# Patient Record
Sex: Female | Born: 1935 | Race: White | Hispanic: No | Marital: Married | Smoking: Never smoker
Health system: Southern US, Community
[De-identification: ages and names within clinical notes are randomized; demographics above are authoritative.]

## PROBLEM LIST (undated history)

## (undated) DIAGNOSIS — I10 Essential (primary) hypertension: Secondary | ICD-10-CM

## (undated) DIAGNOSIS — Z87440 Personal history of urinary (tract) infections: Secondary | ICD-10-CM

## (undated) DIAGNOSIS — E785 Hyperlipidemia, unspecified: Secondary | ICD-10-CM

## (undated) DIAGNOSIS — M40209 Unspecified kyphosis, site unspecified: Secondary | ICD-10-CM

## (undated) DIAGNOSIS — M810 Age-related osteoporosis without current pathological fracture: Secondary | ICD-10-CM

## (undated) DIAGNOSIS — K222 Esophageal obstruction: Secondary | ICD-10-CM

## (undated) DIAGNOSIS — M1990 Unspecified osteoarthritis, unspecified site: Secondary | ICD-10-CM

## (undated) DIAGNOSIS — K219 Gastro-esophageal reflux disease without esophagitis: Secondary | ICD-10-CM

## (undated) DIAGNOSIS — K449 Diaphragmatic hernia without obstruction or gangrene: Secondary | ICD-10-CM

## (undated) HISTORY — PX: TUBAL LIGATION: SHX77

## (undated) HISTORY — DX: Age-related osteoporosis without current pathological fracture: M81.0

## (undated) HISTORY — PX: ABDOMINAL HYSTERECTOMY: SHX81

## (undated) HISTORY — DX: Hyperlipidemia, unspecified: E78.5

## (undated) HISTORY — DX: Diaphragmatic hernia without obstruction or gangrene: K44.9

## (undated) HISTORY — PX: BREAST SURGERY: SHX581

## (undated) HISTORY — DX: Essential (primary) hypertension: I10

## (undated) HISTORY — DX: Esophageal obstruction: K22.2

## (undated) HISTORY — DX: Personal history of urinary (tract) infections: Z87.440

## (undated) HISTORY — DX: Unspecified osteoarthritis, unspecified site: M19.90

## (undated) HISTORY — DX: Unspecified kyphosis, site unspecified: M40.209

## (undated) HISTORY — DX: Gastro-esophageal reflux disease without esophagitis: K21.9

## (undated) HISTORY — PX: HIP SURGERY: SHX245

---

## 1998-03-05 ENCOUNTER — Other Ambulatory Visit: Admission: RE | Admit: 1998-03-05 | Discharge: 1998-03-05 | Payer: Self-pay | Admitting: Gynecology

## 1998-05-05 ENCOUNTER — Other Ambulatory Visit: Admission: RE | Admit: 1998-05-05 | Discharge: 1998-05-05 | Payer: Self-pay | Admitting: Gynecology

## 1999-05-10 ENCOUNTER — Other Ambulatory Visit: Admission: RE | Admit: 1999-05-10 | Discharge: 1999-05-10 | Payer: Self-pay | Admitting: Gynecology

## 2000-07-10 ENCOUNTER — Other Ambulatory Visit: Admission: RE | Admit: 2000-07-10 | Discharge: 2000-07-10 | Payer: Self-pay | Admitting: Gynecology

## 2001-07-12 ENCOUNTER — Other Ambulatory Visit: Admission: RE | Admit: 2001-07-12 | Discharge: 2001-07-12 | Payer: Self-pay | Admitting: Gynecology

## 2002-08-12 ENCOUNTER — Other Ambulatory Visit: Admission: RE | Admit: 2002-08-12 | Discharge: 2002-08-12 | Payer: Self-pay | Admitting: Gynecology

## 2003-08-25 ENCOUNTER — Other Ambulatory Visit: Admission: RE | Admit: 2003-08-25 | Discharge: 2003-08-25 | Payer: Self-pay | Admitting: Gynecology

## 2005-09-21 ENCOUNTER — Ambulatory Visit: Payer: Self-pay | Admitting: Internal Medicine

## 2005-10-06 ENCOUNTER — Other Ambulatory Visit: Admission: RE | Admit: 2005-10-06 | Discharge: 2005-10-06 | Payer: Self-pay | Admitting: Gynecology

## 2006-09-19 DIAGNOSIS — K449 Diaphragmatic hernia without obstruction or gangrene: Secondary | ICD-10-CM

## 2006-09-19 DIAGNOSIS — K219 Gastro-esophageal reflux disease without esophagitis: Secondary | ICD-10-CM

## 2006-09-19 DIAGNOSIS — K222 Esophageal obstruction: Secondary | ICD-10-CM

## 2006-09-19 HISTORY — DX: Esophageal obstruction: K22.2

## 2006-09-19 HISTORY — DX: Gastro-esophageal reflux disease without esophagitis: K21.9

## 2006-09-19 HISTORY — DX: Diaphragmatic hernia without obstruction or gangrene: K44.9

## 2006-11-14 ENCOUNTER — Ambulatory Visit (HOSPITAL_COMMUNITY): Admission: RE | Admit: 2006-11-14 | Discharge: 2006-11-14 | Payer: Self-pay | Admitting: Internal Medicine

## 2006-11-17 ENCOUNTER — Ambulatory Visit: Payer: Self-pay | Admitting: Internal Medicine

## 2006-12-19 ENCOUNTER — Ambulatory Visit (HOSPITAL_COMMUNITY): Admission: RE | Admit: 2006-12-19 | Discharge: 2006-12-19 | Payer: Self-pay | Admitting: Internal Medicine

## 2006-12-25 ENCOUNTER — Ambulatory Visit: Payer: Self-pay | Admitting: Internal Medicine

## 2007-11-06 ENCOUNTER — Ambulatory Visit: Payer: Self-pay | Admitting: Internal Medicine

## 2007-11-20 ENCOUNTER — Ambulatory Visit: Payer: Self-pay | Admitting: Internal Medicine

## 2008-03-19 ENCOUNTER — Encounter: Admission: RE | Admit: 2008-03-19 | Discharge: 2008-03-19 | Payer: Self-pay | Admitting: Unknown Physician Specialty

## 2008-03-20 ENCOUNTER — Encounter: Payer: Self-pay | Admitting: Internal Medicine

## 2008-03-26 ENCOUNTER — Telehealth: Payer: Self-pay | Admitting: Internal Medicine

## 2008-12-05 ENCOUNTER — Encounter: Admission: RE | Admit: 2008-12-05 | Discharge: 2008-12-05 | Payer: Self-pay | Admitting: Unknown Physician Specialty

## 2009-05-26 ENCOUNTER — Ambulatory Visit: Payer: Self-pay | Admitting: Family Medicine

## 2009-05-26 DIAGNOSIS — IMO0002 Reserved for concepts with insufficient information to code with codable children: Secondary | ICD-10-CM | POA: Insufficient documentation

## 2009-05-27 ENCOUNTER — Encounter: Payer: Self-pay | Admitting: Family Medicine

## 2009-06-01 ENCOUNTER — Encounter: Payer: Self-pay | Admitting: Internal Medicine

## 2010-02-26 ENCOUNTER — Encounter: Payer: Self-pay | Admitting: Internal Medicine

## 2010-03-22 ENCOUNTER — Ambulatory Visit: Payer: Self-pay | Admitting: Family Medicine

## 2010-03-22 DIAGNOSIS — B356 Tinea cruris: Secondary | ICD-10-CM | POA: Insufficient documentation

## 2010-10-19 NOTE — Assessment & Plan Note (Signed)
Summary: RASH/TJ   Vital Signs:  Patient Profile:   75 Years Old Female CC:      rash bilateral groin X yesterday Weight:      153 pounds O2 Sat:      99 % O2 treatment:    Room Air Temp:     97.8 degrees F oral Pulse rate:   94 / minute Resp:     14 per minute BP sitting:   166 / 75  (right arm) Cuff size:   regular  Pt. in pain?   no  Vitals Entered By: Lajean Saver RN (March 22, 2010 1:41 PM)                   Updated Prior Medication List: LISINOPRIL 20 MG TABS (LISINOPRIL) once a day LOPRESSOR HCT 50-25 MG TABS (METOPROLOL-HYDROCHLOROTHIAZIDE) once a day ECOTRIN LOW STRENGTH 81 MG TBEC (ASPIRIN) once a day PREVACID 15 MG CPDR (LANSOPRAZOLE) once a day * CALCIUM 1200MG  once a day * VITAMIN D3 1000MG  once a day * MULITVITAMIN once a day  Current Allergies (reviewed today): ! CODEINEHistory of Present Illness Chief Complaint: rash bilateral groin X yesterday History of Present Illness:  Subjective:  Patient noticed a bilateral rash in her groin area yesterday while taking a shower.  She has had no itching or discomfort and is not sure how long the rash has been there.  REVIEW OF SYSTEMS Constitutional Symptoms      Denies fever, chills, night sweats, weight loss, weight gain, and fatigue.  Eyes       Denies change in vision, eye pain, eye discharge, glasses, contact lenses, and eye surgery. Ear/Nose/Throat/Mouth       Denies hearing loss/aids, change in hearing, ear pain, ear discharge, dizziness, frequent runny nose, frequent nose bleeds, sinus problems, sore throat, hoarseness, and tooth pain or bleeding.  Respiratory       Denies dry cough, productive cough, wheezing, shortness of breath, asthma, bronchitis, and emphysema/COPD.  Cardiovascular       Denies murmurs, chest pain, and tires easily with exhertion.    Gastrointestinal       Denies stomach pain, nausea/vomiting, diarrhea, constipation, blood in bowel movements, and indigestion. Genitourniary    Denies painful urination, kidney stones, and loss of urinary control. Neurological       Denies paralysis, seizures, and fainting/blackouts. Musculoskeletal       Denies muscle pain, joint pain, joint stiffness, decreased range of motion, redness, swelling, muscle weakness, and gout.  Skin       Denies bruising, unusual mles/lumps or sores, and hair/skin or nail changes.      Comments: groin Psych       Denies mood changes, temper/anger issues, anxiety/stress, speech problems, depression, and sleep problems. Other Comments: patient states she started using new bath soap 1 week ago. Patient is sexually active with husband   Past History:  Past Medical History: Reviewed history from 05/26/2009 and no changes required. high blood pressure  Past Surgical History: Reviewed history from 05/26/2009 and no changes required. Denies surgical history  Family History: Reviewed history from 05/26/2009 and no changes required. brother - cancer  Social History: Alcohol use-no Drug use-no tobacco use - no Retired Married   Objective:  Appearance:  Patient appears healthy, stated age, and in no acute distress  Skin:  inguinal area:  bilateral macular erythema in intertriginous areas with well circumscribed margins.  No tenderness or swelling.  No drainage. Assessment New Problems: TINEA CRURIS (ICD-110.3)  Plan New Medications/Changes: KETOCONAZOLE 2 % CREA (KETOCONAZOLE) Apply thin layer to affected area two times a day  #45gm x 1, 03/22/2010, Donna Christen MD  New Orders: Est. Patient Level III [phone] Planning Comments:   Begin Nizoral cream two times a day; continue for about 3 days after rash resolved.  May apply 1% HC cream also.  Follow-up with PCP if does not resolve.   The patient and/or caregiver has been counseled thoroughly with regard to medications prescribed including dosage, schedule, interactions, rationale for use, and possible side effects and they verbalize  understanding.  Diagnoses and expected course of recovery discussed and will return if not improved as expected or if the condition worsens. Patient and/or caregiver verbalized understanding.  Prescriptions: KETOCONAZOLE 2 % CREA (KETOCONAZOLE) Apply thin layer to affected area two times a day  #45gm x 1   Entered and Authorized by:   Donna Christen MD   Signed by:   Donna Christen MD on 03/22/2010   Method used:   Print then Give to Patient   RxID:   [id number]   Patient Instructions: 1)  May apply 1% Hydrocortisone cream twice daily for inflammation  Orders Added: 1)  Est. Patient Level III [95621]

## 2010-10-19 NOTE — Letter (Signed)
Summary: [facility]  [facility]   Imported By: Sherian Rein 03/11/2010 09:00:52  _____________________________________________________________________  External Attachment:    Type:   Image     Comment:   External Document

## 2012-05-07 ENCOUNTER — Telehealth: Payer: Self-pay | Admitting: Internal Medicine

## 2012-05-07 NOTE — Telephone Encounter (Signed)
Pt states that she has had hurting and burning right below her ribcage. She is taking prevacid and took pepto bismol Saturday. States she took 3 tablets. This morning her stool was black. Discussed with the pt that the pepto will cause her stool to be black. Scheduled pt to see Dr. Marina Goodell 05/14/12@2 :15pm. Pt aware of appt date and time. Pt knows to call her PCP if she needs to be seen prior to that date.

## 2012-05-14 ENCOUNTER — Encounter: Payer: Self-pay | Admitting: Internal Medicine

## 2012-05-14 ENCOUNTER — Ambulatory Visit (INDEPENDENT_AMBULATORY_CARE_PROVIDER_SITE_OTHER): Payer: Medicare Other | Admitting: Internal Medicine

## 2012-05-14 VITALS — BP 134/78 | HR 60 | Ht 60.0 in | Wt 150.0 lb

## 2012-05-14 DIAGNOSIS — R1310 Dysphagia, unspecified: Secondary | ICD-10-CM

## 2012-05-14 DIAGNOSIS — K219 Gastro-esophageal reflux disease without esophagitis: Secondary | ICD-10-CM

## 2012-05-14 DIAGNOSIS — R1013 Epigastric pain: Secondary | ICD-10-CM

## 2012-05-14 MED ORDER — DEXLANSOPRAZOLE 60 MG PO CPDR: 60.0000 mg | DELAYED_RELEASE_CAPSULE | Freq: Every day | ORAL | Status: DC

## 2012-05-14 NOTE — Patient Instructions (Addendum)
You have been scheduled for an abdominal ultrasound at [facility] (1st floor of hospital) on 05-16-12 at 8:00am. Please arrive 15 minutes prior to your appointment for registration. Make certain not to have anything to eat or drink after midnight prior to your appointment. Should you need to reschedule your appointment, please contact radiology at [phone].  You have been scheduled for an endoscopy with propofol. Please follow written instructions given to you at your visit today. If you use inhalers (even only as needed), please bring them with you on the day of your procedure.  We have given you some samples of Dexilant.  Take one a day

## 2012-05-14 NOTE — Progress Notes (Signed)
HISTORY OF PRESENT ILLNESS:  Destiny Bruce is a 76 y.o. female with the below listed medical history who is seen in this office for GERD complicated by peptic stricture. She presents today with complaints of worsening reflux disease and recurrent dysphagia. For her reflux, she has been on over-the-counter Prevacid (presumably 15 mg). 9 days ago she developed herbal epigastric burning. Since that time, she has had some pyrosis. She has changed her diet to include more bland foods. She thinks this helps. She does not identify any additional exacerbating or relieving factors. She did undergo upper endoscopy with esophageal dilation 2008. She feels like her dysphagia has been worse over the past 6-8 months. She denies any lower GI complaints. She had a normal colonoscopy in March of 2009. Her chronic medical problems are stable.  REVIEW OF SYSTEMS:  All non-GI ROS negative except for  Past Medical History  Diagnosis Date  . Hx: UTI (urinary tract infection)   . GERD (gastroesophageal reflux disease) 2008  . Stricture and stenosis of esophagus 2008  . Hiatal hernia 2008  . Arthritis   . Hypertension   . Hyperlipemia     Past Surgical History  Procedure Date  . Hip surgery   . Tubal ligation   . Breast surgery     Cyst removed     Social History Destiny Bruce  reports that she has never smoked. She has never used smokeless tobacco. She reports that she does not drink alcohol or use illicit drugs.  family history is negative for Colon cancer.  Allergies  Allergen Reactions  . Codeine        PHYSICAL EXAMINATION: Vital signs: BP 134/78  Pulse 60  Ht 5' (1.524 m)  Wt 150 lb (68.04 kg)  BMI 29.30 kg/m2  Constitutional: generally well-appearing, no acute distress Psychiatric: alert and oriented x3, cooperative Eyes: extraocular movements intact, anicteric, conjunctiva pink Mouth: oral pharynx moist, no lesions Neck: supple no lymphadenopathy Cardiovascular: heart  regular rate and rhythm, no murmur Lungs: clear to auscultation bilaterally Abdomen: soft, nontender, nondistended, no obvious ascites, no peritoneal signs, normal bowel sounds, no organomegaly Rectal:omitted Extremities: no lower extremity edema bilaterally Skin: no lesions on visible extremities Neuro: No focal deficits.   ASSESSMENT:  #1. Worsening reflux on low-dose Prevacid #2. Recurrent dysphagia likely due to peptic stricture #3. Normal screening colonoscopy 2009   PLAN:  #1. Reflux precautions #2. Initiate Dexilant 60 mg daily. Multiple samples provided #3. Schedule upper endoscopy with esophageal dilation.The nature of the procedure, as well as the risks, benefits, and alternatives were carefully and thoroughly reviewed with the patient. Ample time for discussion and questions allowed. The patient understood, was satisfied, and agreed to proceed.

## 2012-05-16 ENCOUNTER — Ambulatory Visit (HOSPITAL_COMMUNITY)
Admission: RE | Admit: 2012-05-16 | Discharge: 2012-05-16 | Disposition: A | Discharge status: Home / Self Care | Payer: Medicare Other | Source: Ambulatory Visit | Attending: Internal Medicine | Admitting: Internal Medicine

## 2012-05-16 DIAGNOSIS — K219 Gastro-esophageal reflux disease without esophagitis: Secondary | ICD-10-CM

## 2012-05-16 DIAGNOSIS — R1310 Dysphagia, unspecified: Secondary | ICD-10-CM

## 2012-05-16 DIAGNOSIS — N289 Disorder of kidney and ureter, unspecified: Secondary | ICD-10-CM | POA: Insufficient documentation

## 2012-05-16 DIAGNOSIS — R1013 Epigastric pain: Secondary | ICD-10-CM | POA: Insufficient documentation

## 2012-05-29 ENCOUNTER — Encounter: Payer: Self-pay | Admitting: Internal Medicine

## 2012-05-29 ENCOUNTER — Ambulatory Visit (AMBULATORY_SURGERY_CENTER): Payer: Medicare Other | Admitting: Internal Medicine

## 2012-05-29 VITALS — BP 125/69 | HR 73 | Temp 97.4°F | Resp 14 | Ht 60.0 in | Wt 150.0 lb

## 2012-05-29 DIAGNOSIS — R1013 Epigastric pain: Secondary | ICD-10-CM

## 2012-05-29 DIAGNOSIS — R1310 Dysphagia, unspecified: Secondary | ICD-10-CM

## 2012-05-29 DIAGNOSIS — K219 Gastro-esophageal reflux disease without esophagitis: Secondary | ICD-10-CM

## 2012-05-29 DIAGNOSIS — K222 Esophageal obstruction: Secondary | ICD-10-CM

## 2012-05-29 MED ORDER — SODIUM CHLORIDE 0.9 % IV SOLN: 500.0000 mL | INTRAVENOUS | Status: DC

## 2012-05-29 MED ORDER — OMEPRAZOLE 40 MG PO CPDR: 40.0000 mg | DELAYED_RELEASE_CAPSULE | Freq: Every day | ORAL | Status: DC

## 2012-05-29 NOTE — Op Note (Signed)
[facility] [address] [place], [address]   ENDOSCOPY PROCEDURE REPORT  PATIENT: Destiny, Bruce  MR#: [id number] BIRTHDATE: [birth year] , 76  yrs. old GENDER: Female ENDOSCOPIST: Roxy Cedar, MD REFERRED BY:  Office PROCEDURE DATE:  05/29/2012 PROCEDURE:  Balloon dilation of esophagus  - 18,19,67mm ASA CLASS:     Class II INDICATIONS:  history of esophageal reflux.; epigastric pain (better on Dexilant)   dysphagia. MEDICATIONS: MAC sedation, administered by CRNA and propofol (Diprivan) 180mg  IV TOPICAL ANESTHETIC: Cetacaine Spray  DESCRIPTION OF PROCEDURE: After the risks benefits and alternatives of the procedure were thoroughly explained, informed consent was obtained.  The LB [id number]-H180 [id number] endoscope was introduced through the mouth and advanced to the second portion of the duodenum. Without limitations.  The instrument was slowly withdrawn as the mucosa was fully examined.      ESOPHAGUS: A large caliber ring-likestricture was found at GEJ. STOMACH: A 5cm hiatus hernia was found.   The remainder of the upper endoscopy exam was otherwise normal.  Retroflexed views revealed the hiatal hernia.  THERAPY: TTS BALLOON SEQUENTIAL DILATION - 18,19,20MM. MILD RESISTANCE. NO HEME. TOLERATED WELL    The scope was then withdrawn from the patient and the procedure completed.  COMPLICATIONS: There were no complications. ENDOSCOPIC IMPRESSION: 1.   Stricture was found in the distal esophagus - s/p dilation 2.   Hiatus hernia was found 3.   The remainder of the upper endoscopy exam was otherwise normal  RECOMMENDATIONS: 1.  Clear liquids until 11am , then soft foods rest of day.  Resume prior diet tomorrow. 2.  Continue PPI (PRESCRIBE OMEPRAZOLE 40 MG DAILY; #30; ONE PO QD; 11 REFILLS) 3.  Follow clinical response to dilatation.  e]  eSigned:  Roxy Cedar, MD 05/29/2012 9:05 AM VZ:DGLOV Sharee Pimple, MD and The Patient

## 2012-05-29 NOTE — Progress Notes (Signed)
Patient did not have preoperative order for IV antibiotic SSI prophylaxis. (G8918)  Patient did not experience any of the following events: a burn prior to discharge; a fall within the facility; wrong site/side/patient/procedure/implant event; or a hospital transfer or hospital admission upon discharge from the facility. (G8907)  

## 2012-05-29 NOTE — Patient Instructions (Addendum)
YOU HAD AN ENDOSCOPIC PROCEDURE TODAY AT THE [facility]: Refer to the procedure report that was given to you for any specific questions about what was found during the examination.  If the procedure report does not answer your questions, please call your gastroenterologist to clarify.  If you requested that your care partner not be given the details of your procedure findings, then the procedure report has been included in a sealed envelope for you to review at your convenience later.  YOU SHOULD EXPECT: Some feelings of bloating in the abdomen. Passage of more gas than usual.  Walking can help get rid of the air that was put into your GI tract during the procedure and reduce the bloating. If you had a lower endoscopy (such as a colonoscopy or flexible sigmoidoscopy) you may notice spotting of blood in your stool or on the toilet paper. If you underwent a bowel prep for your procedure, then you may not have a normal bowel movement for a few days.  DIET: Your first meal following the procedure should be a light meal and then it is ok to progress to your normal diet.  A half-sandwich or bowl of soup is an example of a good first meal.  Heavy or fried foods are harder to digest and may make you feel nauseous or bloated.  Likewise meals heavy in dairy and vegetables can cause extra gas to form and this can also increase the bloating.  Drink plenty of fluids but you should avoid alcoholic beverages for 24 hours.  ACTIVITY: Your care partner should take you home directly after the procedure.  You should plan to take it easy, moving slowly for the rest of the day.  You can resume normal activity the day after the procedure however you should NOT DRIVE or use heavy machinery for 24 hours (because of the sedation medicines used during the test).    SYMPTOMS TO REPORT IMMEDIATELY: A gastroenterologist can be reached at any hour.  During normal business hours, 8:30 AM to 5:00 PM Monday through Friday,  call [phone].  After hours and on weekends, please call the GI answering service at [phone] who will take a message and have the physician on call contact you.   Following lower endoscopy (colonoscopy or flexible sigmoidoscopy):  Excessive amounts of blood in the stool  Significant tenderness or worsening of abdominal pains  Swelling of the abdomen that is new, acute  Fever of 100F or higher  Following upper endoscopy (EGD)  Vomiting of blood or coffee ground material  New chest pain or pain under the shoulder blades  Painful or persistently difficult swallowing  New shortness of breath  Fever of 100F or higher  Black, tarry-looking stools  FOLLOW UP: If any biopsies were taken you will be contacted by phone or by letter within the next 1-3 weeks.  Call your gastroenterologist if you have not heard about the biopsies in 3 weeks.  Our staff will call the home number listed on your records the next business day following your procedure to check on you and address any questions or concerns that you may have at that time regarding the information given to you following your procedure. This is a courtesy call and so if there is no answer at the home number and we have not heard from you through the emergency physician on call, we will assume that you have returned to your regular daily activities without incident.  SIGNATURES/CONFIDENTIALITY: You and/or your care   partner have signed paperwork which will be entered into your electronic medical record.  These signatures attest to the fact that that the information above on your After Visit Summary has been reviewed and is understood.  Full responsibility of the confidentiality of this discharge information lies with you and/or your care-partner.  

## 2012-05-30 ENCOUNTER — Telehealth: Payer: Self-pay | Admitting: *Deleted

## 2012-05-30 NOTE — Telephone Encounter (Signed)
  Follow up Call-  Call back number 05/29/2012  Post procedure Call Back phone  # [phone]  Permission to leave phone message Yes     Patient questions:  Do you have a fever, pain , or abdominal swelling? no Pain Score  0 *  Have you tolerated food without any problems? yes  Have you been able to return to your normal activities? yes  Do you have any questions about your discharge instructions: Diet   no Medications  no Follow up visit  no  Do you have questions or concerns about your Care? no  Actions: * If pain score is 4 or above: No action needed, pain <4.

## 2013-06-04 ENCOUNTER — Encounter: Payer: Self-pay | Admitting: *Deleted

## 2013-06-04 ENCOUNTER — Emergency Department
Admission: EM | Admit: 2013-06-04 | Discharge: 2013-06-04 | Disposition: A | Discharge status: Home / Self Care | Payer: Medicare Other | Source: Home / Self Care | Attending: Family Medicine | Admitting: Family Medicine

## 2013-06-04 DIAGNOSIS — L0291 Cutaneous abscess, unspecified: Secondary | ICD-10-CM

## 2013-06-04 DIAGNOSIS — L02519 Cutaneous abscess of unspecified hand: Secondary | ICD-10-CM

## 2013-06-04 MED ORDER — DOXYCYCLINE HYCLATE 100 MG PO CAPS: 100.0000 mg | ORAL_CAPSULE | Freq: Two times a day (BID) | ORAL | Status: AC

## 2013-06-04 NOTE — ED Provider Notes (Signed)
CSN: [id number]     Arrival date & time 06/04/13  1841 History   First MD Initiated Contact with Patient 06/04/13 1901     Chief Complaint  Patient presents with  . Abscess    HPI  Patient presents today with chief complaint of right fifth finger abscess. Patient's nose progressive redness and swelling of the proximal aspect of her right fifth digit of the past 2-3 days. No fevers or chills. Has become progressively tender. No drainage at this point though a habit-forming. Patient is nondiabetic. Nonsmoker. Past Medical History  Diagnosis Date  . Hx: UTI (urinary tract infection)   . GERD (gastroesophageal reflux disease) 2008  . Stricture and stenosis of esophagus 2008  . Hiatal hernia 2008  . Arthritis   . Hypertension   . Hyperlipemia    Past Surgical History  Procedure Laterality Date  . Hip surgery    . Tubal ligation    . Breast surgery      Cyst removed    Family History  Problem Relation Age of Onset  . Colon cancer Neg Hx    History  Substance Use Topics  . Smoking status: Never Smoker   . Smokeless tobacco: Never Used  . Alcohol Use: No   OB History   Grav Para Term Preterm Abortions TAB SAB Ect Mult Living                 Review of Systems  All other systems reviewed and are negative.    Allergies  Codeine  Home Medications   Current Outpatient Rx  Name  Route  Sig  Dispense  Refill  . aspirin (ECOTRIN LOW STRENGTH) 81 MG EC tablet   Oral   Take 81 mg by mouth daily. Swallow whole.         . Calcium-Vitamin A-Vitamin D (LIQUID CALCIUM PO)   Oral   Take by mouth as directed.         . Cholecalciferol (VITAMIN D3) 2000 UNITS TABS   Oral   Take 1 tablet by mouth daily.         . Coenzyme Q10 (COQ10 PO)   Oral   Take 1 capsule by mouth daily.         . Cyanocobalamin (VITAMIN B-12 PO)   Oral   Take 1 capsule by mouth daily.         Marland Kitchen EXPIRED: dexlansoprazole (DEXILANT) 60 MG capsule   Oral   Take 1 capsule (60 mg total)  by mouth daily.   25 capsule   0     Samples given to patient    Lot#        [id number]     ...   . doxycycline (VIBRAMYCIN) 100 MG capsule   Oral   Take 1 capsule (100 mg total) by mouth 2 (two) times daily.   14 capsule   0   . Krill Oil 300 MG CAPS   Oral   Take 1 capsule by mouth daily.         . Lansoprazole (PREVACID PO)   Oral   Take by mouth. Uses OTC         . lisinopril (PRINIVIL,ZESTRIL) 20 MG tablet   Oral   Take 20 mg by mouth daily.         . metoprolol tartrate (LOPRESSOR) 25 MG tablet   Oral   Take 25 mg by mouth daily.         Marland Kitchen EXPIRED: omeprazole (  PRILOSEC) 40 MG capsule   Oral   Take 1 capsule (40 mg total) by mouth daily.   30 capsule   11   . simvastatin (ZOCOR) 10 MG tablet   Oral   Take 10 mg by mouth at bedtime.         Marland Kitchen VITAMIN E PO   Oral   Take 1 capsule by mouth daily.          BP 137/85  Pulse 78  Temp(Src) 98.3 F (36.8 C) (Oral)  Resp 16  Ht 5' (1.524 m)  Wt 154 lb (69.854 kg)  BMI 30.08 kg/m2  SpO2 99% Physical Exam  Constitutional: She appears well-developed and well-nourished.  HENT:  Head: Normocephalic and atraumatic.  Eyes: Pupils are equal, round, and reactive to light.  Neck: Normal range of motion. Neck supple.  Cardiovascular: Normal rate and regular rhythm.   Pulmonary/Chest: Effort normal.  Abdominal: Soft.  Musculoskeletal: Normal range of motion.  Neurological: She is alert.  Skin: Rash noted.    ED Course  INCISION AND DRAINAGE Date/Time: 06/04/2013 7:31 PM Performed by: Doree Albee Authorized by: Doree Albee Consent: Verbal consent obtained. Risks and benefits: risks, benefits and alternatives were discussed Type: abscess Body area: upper extremity Location details: right hand Anesthesia method: No anesthesia needed. Superficial incision and drainage. Scalpel size: 11 Complexity: simple Drainage: purulent Wound treatment: wound left open Packing material: Vaseline  gauze Patient tolerance: Patient tolerated the procedure well with no immediate complications.   (including critical care time) Labs Review Labs Reviewed - No data to display Imaging Review No results found.  MDM   1. Abscess    Area superficially incised and drained at bedside with expression of purulent fluid. Wound culture obtained. Will place patient on doxycycline for soft tissue coverage. Discussed general, [place], infectious red flags the patient. Followup with PCP in the next 3-5 days for reevaluation in symptoms and not prove.     The patient and/or caregiver has been counseled thoroughly with regard to treatment plan and/or medications prescribed including dosage, schedule, interactions, rationale for use, and possible side effects and they verbalize understanding. Diagnoses and expected course of recovery discussed and will return if not improved as expected or if the condition worsens. Patient and/or caregiver verbalized understanding.         Doree Albee, MD 06/04/13 [phone]

## 2013-06-04 NOTE — ED Notes (Signed)
Pt c/o abscess on her RT 5th digit x 2 days.

## 2013-06-07 ENCOUNTER — Telehealth: Payer: Self-pay | Admitting: *Deleted

## 2013-06-07 LAB — WOUND CULTURE: Gram Stain: NONE SEEN

## 2013-06-12 ENCOUNTER — Telehealth: Payer: Self-pay | Admitting: Family Medicine

## 2013-06-12 MED ORDER — CEPHALEXIN 500 MG PO CAPS: 500.0000 mg | ORAL_CAPSULE | Freq: Three times a day (TID) | ORAL | Status: DC

## 2013-06-13 ENCOUNTER — Telehealth: Payer: Self-pay | Admitting: Family Medicine

## 2014-03-04 ENCOUNTER — Encounter: Payer: Self-pay | Admitting: Internal Medicine

## 2014-03-04 ENCOUNTER — Ambulatory Visit (INDEPENDENT_AMBULATORY_CARE_PROVIDER_SITE_OTHER): Payer: Commercial Managed Care - HMO | Admitting: Internal Medicine

## 2014-03-04 VITALS — BP 120/80 | HR 68 | Ht 60.0 in | Wt 146.6 lb

## 2014-03-04 DIAGNOSIS — K219 Gastro-esophageal reflux disease without esophagitis: Secondary | ICD-10-CM

## 2014-03-04 DIAGNOSIS — K222 Esophageal obstruction: Secondary | ICD-10-CM

## 2014-03-04 DIAGNOSIS — R195 Other fecal abnormalities: Secondary | ICD-10-CM

## 2014-03-04 MED ORDER — MOVIPREP 100 G PO SOLR: 1.0000 | Freq: Once | ORAL | Status: DC

## 2014-03-04 NOTE — Patient Instructions (Signed)

## 2014-03-04 NOTE — Progress Notes (Signed)
HISTORY OF PRESENT ILLNESS:  Destiny Bruce is a 78 y.o. female with multiple medical problems as listed below. She sent today by her primary care provider regarding Hemoccult-positive stool on routine testing with her annual physical exam. The patient was last seen in this office 05/14/2012 regarding worsening reflux with low dose Prevacid and recurrent dysphagia. She was prescribed Dexilant and subsequently underwent upper endoscopy with balloon dilation of a peptic stricture on 05/29/2012. She was to continue on PPI and followup as needed. She did undergo complete colonoscopy March 2009 for the purposes of routine screening. This was normal. Patient has resumed low-dose Prevacid. She denies reflux symptoms or recurrent dysphagia. She is known to have hemorrhoids but denies melena or hematochezia. She also denies weight loss or anemia.  REVIEW OF SYSTEMS:  All non-GI ROS negative except for arthritis  Past Medical History  Diagnosis Date  . Hx: UTI (urinary tract infection)   . GERD (gastroesophageal reflux disease) 2008  . Stricture and stenosis of esophagus 2008  . Hiatal hernia 2008  . Arthritis   . Hypertension   . Hyperlipemia   . Kyphosis   . Osteoporosis     Past Surgical History  Procedure Laterality Date  . Hip surgery    . Tubal ligation    . Breast surgery      Cyst removed     Social History Destiny Bruce  reports that she has never smoked. She has never used smokeless tobacco. She reports that she does not drink alcohol or use illicit drugs.  family history includes CAD in her maternal uncle; Cancer in her maternal grandfather and paternal aunt; GER disease in her mother; Heart disease in her sister; Heart failure in her father; Leukemia in her paternal uncle; Lung cancer in her brother; Prostate cancer in her maternal uncle. There is no history of Colon cancer.  Allergies  Allergen Reactions  . Codeine        PHYSICAL EXAMINATION: Vital signs: BP  120/80  Pulse 68  Ht 5' (1.524 m)  Wt 146 lb 9.6 oz (66.497 kg)  BMI 28.63 kg/m2  Constitutional: generally well-appearing, no acute distress Psychiatric: alert and oriented x3, cooperative Eyes: extraocular movements intact, anicteric, conjunctiva pink Mouth: oral pharynx moist, no lesions Neck: supple no lymphadenopathy Cardiovascular: heart regular rate and rhythm, no murmur Lungs: clear to auscultation bilaterally Abdomen: soft, nontender, nondistended, no obvious ascites, no peritoneal signs, normal bowel sounds, no organomegaly Rectal: Deferred until colonoscopy Extremities: no lower extremity edema bilaterally Skin: no lesions on visible extremities Neuro: No focal deficits.   ASSESSMENT:  #1. Hemoccult-positive stool #2. GERD complicated by peptic stricture. Currently asymptomatic post dilation (2013) on PPI #3. Normal screening colonoscopy 2009 #4. General medical problems. Stable   PLAN:  #1. Continue reflux precautions and PPI #2. Schedule colonoscopy to evaluate Hemoccult-positive stool.The nature of the procedure, as well as the risks, benefits, and alternatives were carefully and thoroughly reviewed with the patient. Ample time for discussion and questions allowed. The patient understood, was satisfied, and agreed to proceed. Movi prep prescribed. The patient instructed on its use

## 2014-03-14 ENCOUNTER — Encounter: Payer: Self-pay | Admitting: Internal Medicine

## 2014-03-14 ENCOUNTER — Ambulatory Visit (AMBULATORY_SURGERY_CENTER): Payer: Commercial Managed Care - HMO | Admitting: Internal Medicine

## 2014-03-14 VITALS — BP 150/80 | HR 76 | Temp 96.1°F | Resp 27 | Ht 60.0 in | Wt 146.0 lb

## 2014-03-14 DIAGNOSIS — R195 Other fecal abnormalities: Secondary | ICD-10-CM

## 2014-03-14 MED ORDER — SODIUM CHLORIDE 0.9 % IV SOLN: 500.0000 mL | INTRAVENOUS | Status: DC

## 2014-03-14 NOTE — Progress Notes (Signed)
Report to PACU, RN, vss, BBS= Clear.  

## 2014-03-14 NOTE — Op Note (Signed)
[facility]  [address]. [place], [address]   COLONOSCOPY PROCEDURE REPORT  PATIENT: Destiny Bruce, Destiny Bruce  MR#: [id number] BIRTHDATE: Feb 11, 1936 , 78  yrs. old GENDER: Female ENDOSCOPIST: Eustace Quail, MD REFERRED TL:XBWIO Jones, FNP PROCEDURE DATE:  03/14/2014 PROCEDURE:   Colonoscopy, diagnostic First Screening Colonoscopy - Avg.  risk and is 50 yrs.  old or older - No.  Prior Negative Screening - Now for repeat screening. N/A  History of Adenoma - Now for follow-up colonoscopy & has been > or = to 3 yrs.  N/A  Polyps Removed Today? No.  Recommend repeat exam, <10 yrs? No. ASA CLASS:   Class II INDICATIONS:heme-positive stool.   Normal colonoscopy 11-2007 MEDICATIONS: MAC sedation, administered by CRNA and propofol (Diprivan) 200mg  IV  DESCRIPTION OF PROCEDURE:   After the risks benefits and alternatives of the procedure were thoroughly explained, informed consent was obtained.  A digital rectal exam revealed no abnormalities of the rectum.   The LB [id number]  endoscope was introduced through the anus and advanced to the cecum, which was identified by both the appendix and ileocecal valve. No adverse events experienced.   The quality of the prep was excellent, using MoviPrep  The instrument was then slowly withdrawn as the colon was fully examined.      COLON FINDINGS: An angiodysplastic lesion was found in the ascending colon.   The colon mucosa was otherwise normal.  Retroflexed views revealed no abnormalities. The time to cecum=3 minutes 12 seconds. Withdrawal time=9 minutes 52 seconds.  The scope was withdrawn and the procedure completed.  COMPLICATIONS: There were no complications.  ENDOSCOPIC IMPRESSION: 1.   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   The colon mucosa was otherwise normal  RECOMMENDATIONS: 1. Return to the care of your primary provider.  GI follow up as needed. No need for heme occult stools for screening in  this patient   eSigned:  Eustace Quail, MD 03/14/2014 10:16 AM   cc: The Patient and Dyane Dustman, MD

## 2014-03-14 NOTE — Patient Instructions (Signed)

## 2014-03-17 ENCOUNTER — Telehealth: Payer: Self-pay

## 2014-03-17 NOTE — Telephone Encounter (Signed)
  Follow up Call-  Call back number 03/14/2014 05/29/2012  Post procedure Call Back phone  # [phone]  Permission to leave phone message Yes Yes     Patient questions:  Do you have a fever, pain , or abdominal swelling? No. Pain Score  0 *  Have you tolerated food without any problems? Yes.    Have you been able to return to your normal activities? Yes.    Do you have any questions about your discharge instructions: Diet   No. Medications  No. Follow up visit  No.  Do you have questions or concerns about your Care? No.  Actions: * If pain score is 4 or above: No action needed, pain <4.  No problems per the pt. Maw

## 2014-06-05 IMAGING — US US ABDOMEN COMPLETE
1 series · 14 of 25 positions shown · non-contrast
Comparison: None.

CLINICAL DATA: Epigastric pain

COMPLETE ABDOMINAL ULTRASOUND

[Series 1: us abdomen complete · 0.24mm/px · 14 of 82 slices shown]
[im 1/82]
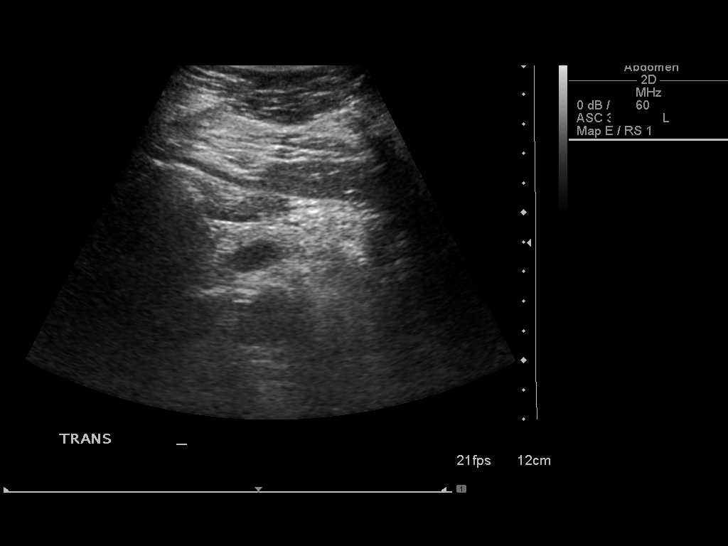
[im 7/82]
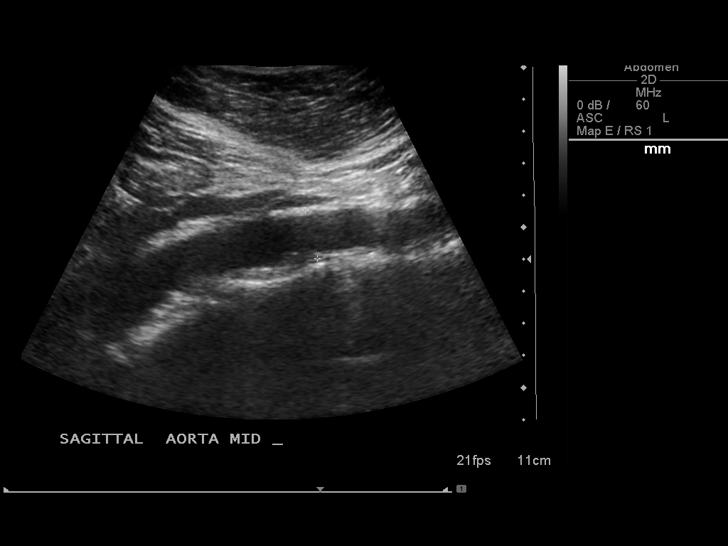
[im 14/82]
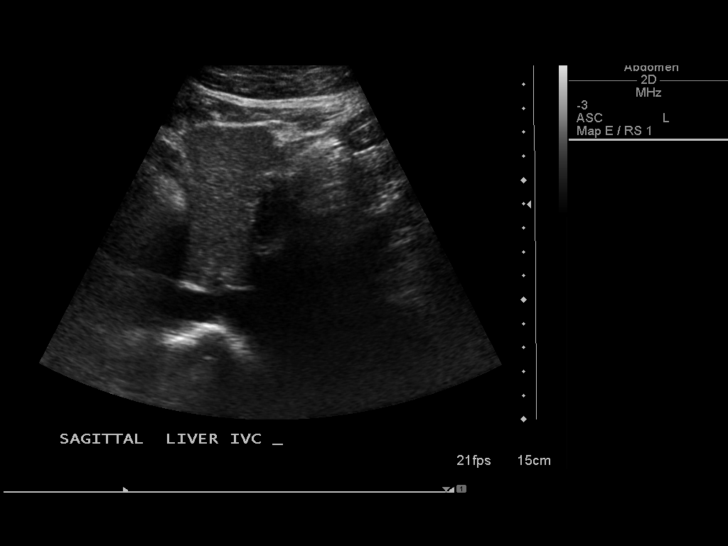
[im 21/82]
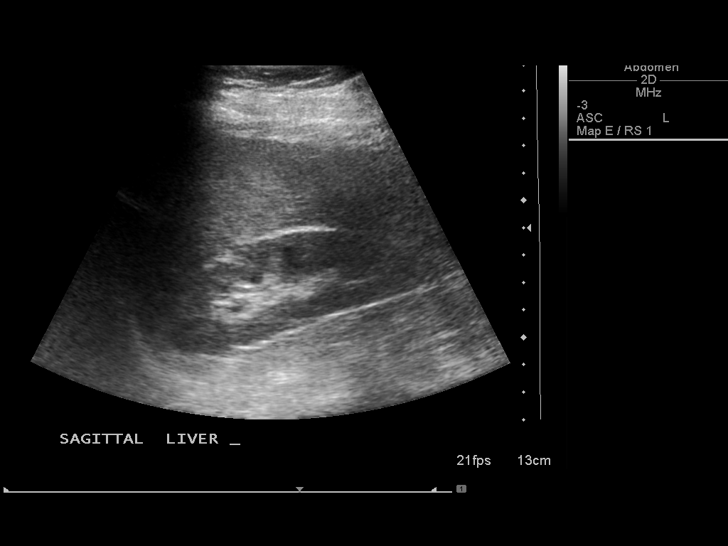
[im 28/82]
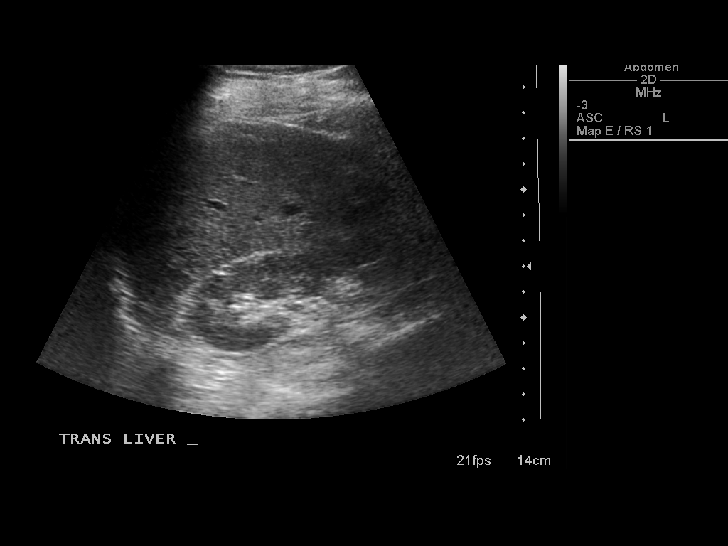
[im 31/82]
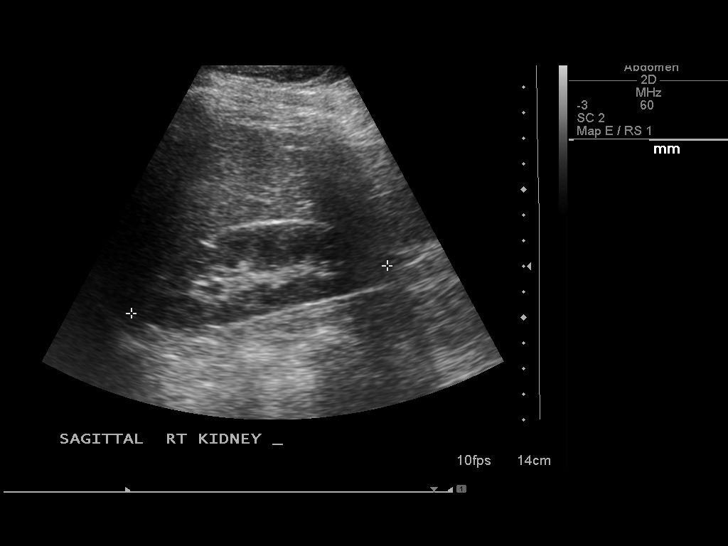
[im 38/82]
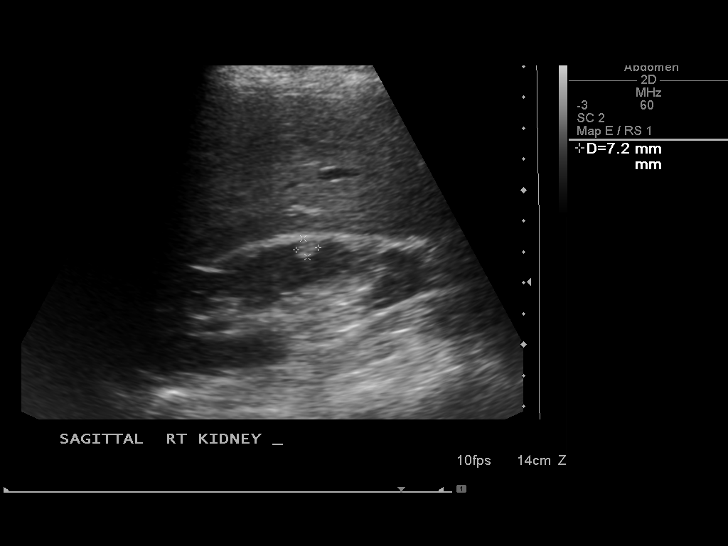
[im 44/82]
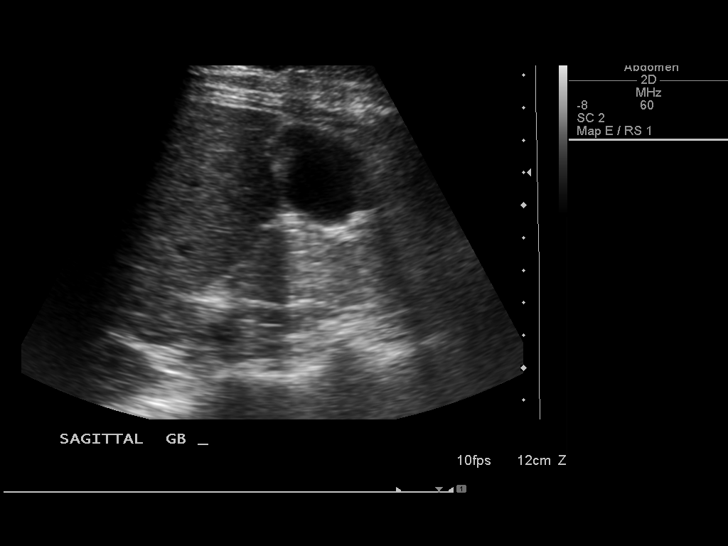
[im 51/82]
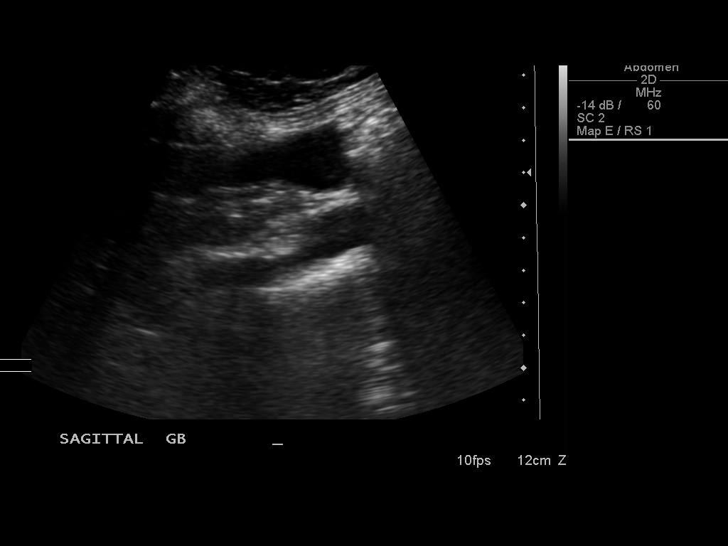
[im 55/82]
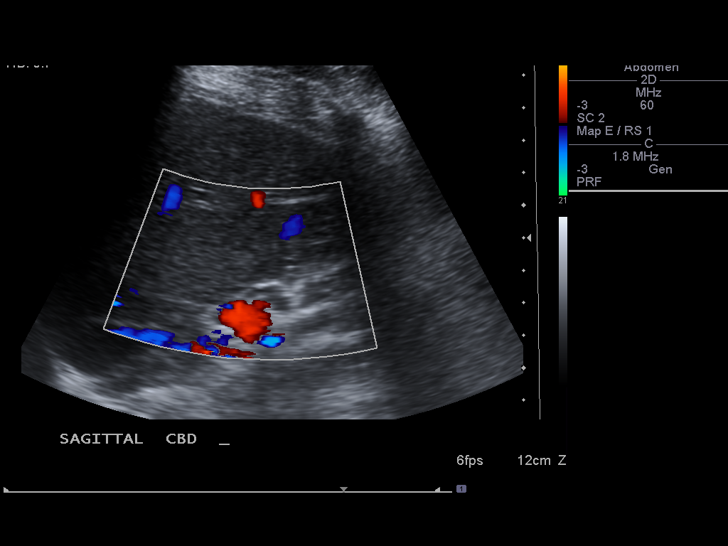
[im 61/82]
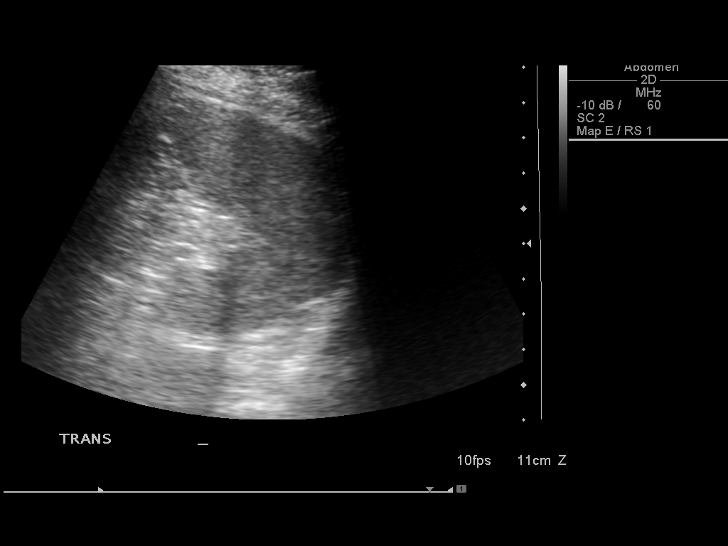
[im 68/82]
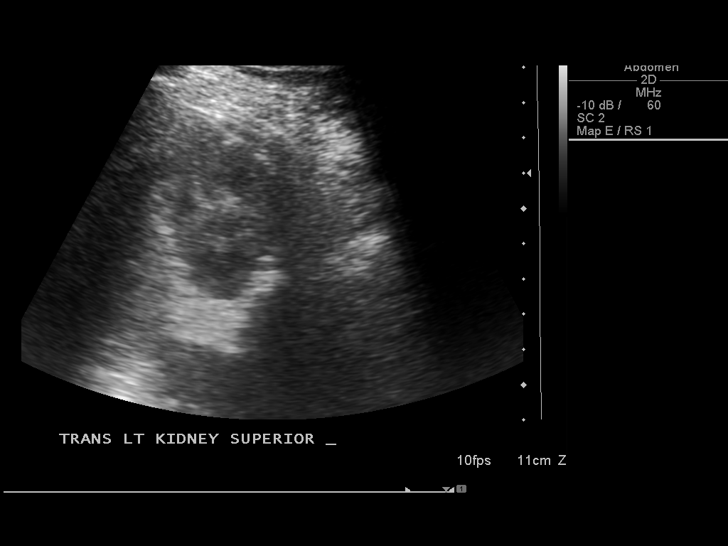
[im 75/82]
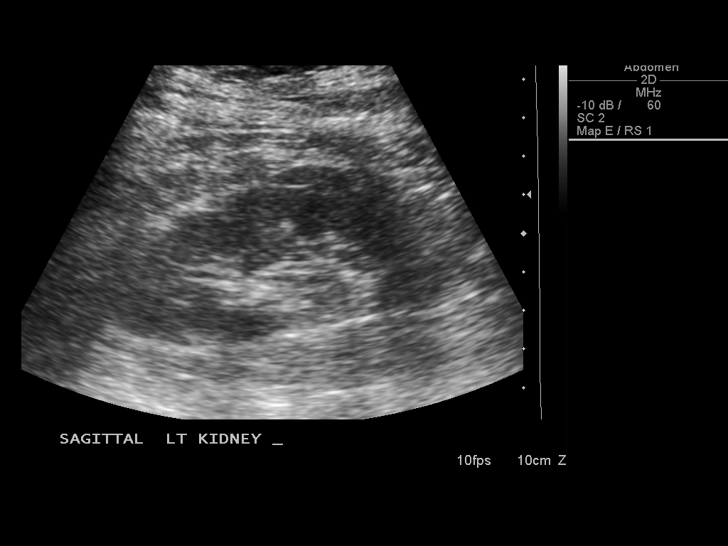
[im 82/82]
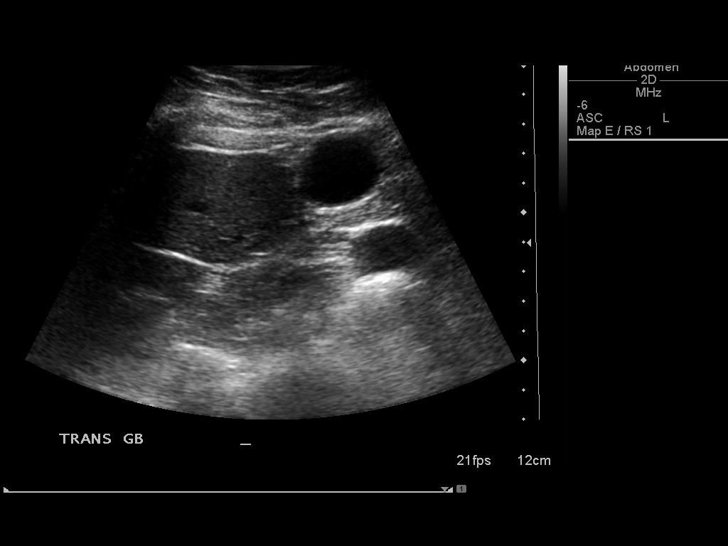

[14 of 25 positions shown; findings below may reference images not displayed]

FINDINGS: Gallbladder:  No gallstones, gallbladder wall thickening, or
pericholecystic fluid.

Common bile duct:  Normal at 5 mm

Liver:  No focal lesion identified.  Within normal limits in
parenchymal echogenicity.

IVC:  Appears normal.

Pancreas:  No focal abnormality seen.

Spleen:  Normal in size and echogenicity.

Right Kidney:  10.2cm in length.  Small cortical echoic lesion is
present in the mid right kidney measuring the 8 mm.

Left Kidney:  10.2cm in length.  Small cortical hyperechoic lesion
measuring 6 mm in the left kidney.

Abdominal aorta:  No aneurysm identified.
IMPRESSION: 1.. Normal gallbladder.

2.  Single small hyperechoic lesions within the left and right
kidney.  These likely represent small angiomyolipomas.  This should
be confirmed with a renal protocol contrast CT or MRI .

## 2015-01-08 DIAGNOSIS — N764 Abscess of vulva: Secondary | ICD-10-CM | POA: Diagnosis not present

## 2015-01-21 DIAGNOSIS — L570 Actinic keratosis: Secondary | ICD-10-CM | POA: Diagnosis not present

## 2015-01-21 DIAGNOSIS — C44612 Basal cell carcinoma of skin of right upper limb, including shoulder: Secondary | ICD-10-CM | POA: Diagnosis not present

## 2015-02-04 DIAGNOSIS — M810 Age-related osteoporosis without current pathological fracture: Secondary | ICD-10-CM | POA: Diagnosis not present

## 2015-02-04 DIAGNOSIS — E780 Pure hypercholesterolemia: Secondary | ICD-10-CM | POA: Diagnosis not present

## 2015-02-04 DIAGNOSIS — Z23 Encounter for immunization: Secondary | ICD-10-CM | POA: Diagnosis not present

## 2015-02-04 DIAGNOSIS — M170 Bilateral primary osteoarthritis of knee: Secondary | ICD-10-CM | POA: Diagnosis not present

## 2015-02-04 DIAGNOSIS — I10 Essential (primary) hypertension: Secondary | ICD-10-CM | POA: Diagnosis not present

## 2015-03-30 ENCOUNTER — Telehealth: Payer: Self-pay | Admitting: Internal Medicine

## 2015-03-30 NOTE — Telephone Encounter (Signed)
Pt states she is having pain when she swallows and when she tries anything with acid in it there is terrible burning. Pt scheduled to see Alonza Bogus PA tomorrow at 2:30pm. Pt aware of appt.

## 2015-03-31 ENCOUNTER — Encounter: Payer: Self-pay | Admitting: Gastroenterology

## 2015-03-31 ENCOUNTER — Ambulatory Visit (INDEPENDENT_AMBULATORY_CARE_PROVIDER_SITE_OTHER): Payer: Commercial Managed Care - HMO | Admitting: Gastroenterology

## 2015-03-31 VITALS — BP 138/84 | HR 72 | Ht 58.75 in | Wt 134.4 lb

## 2015-03-31 DIAGNOSIS — F458 Other somatoform disorders: Secondary | ICD-10-CM

## 2015-03-31 DIAGNOSIS — R198 Other specified symptoms and signs involving the digestive system and abdomen: Secondary | ICD-10-CM

## 2015-03-31 DIAGNOSIS — Z8719 Personal history of other diseases of the digestive system: Secondary | ICD-10-CM | POA: Diagnosis not present

## 2015-03-31 DIAGNOSIS — R1310 Dysphagia, unspecified: Secondary | ICD-10-CM | POA: Diagnosis not present

## 2015-03-31 DIAGNOSIS — R0989 Other specified symptoms and signs involving the circulatory and respiratory systems: Secondary | ICD-10-CM | POA: Insufficient documentation

## 2015-03-31 NOTE — Progress Notes (Signed)
Agree with assessment and plans as outlined 

## 2015-03-31 NOTE — Progress Notes (Signed)
     03/31/2015 Destiny Bruce [id number] March 15, 1936   History of Present Illness:  This is a pleasant 79 year old female who is known to Dr. Henrene Pastor.  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  Has history of GERD and has been on OTC prevacid once a day for quite some time.  Her last EGD was 05/2013 at which time she was found to have a hiatal hernia and a stricture in the distal esophagus that was dilated with balloon.  She presents to our office today with complaints of pain/burning in her esophagus as well as what sounds like a globus sensation.  She said that this began 5 days ago.  After a couple of days she increased her prevacid to twice a day and that seems like it may be helping but she is still having symptoms.  Denies sensation of food getting stuck.     Current Medications, Allergies, Past Medical History, Past Surgical History, Family History and Social History were reviewed in [employer] record.   Physical Exam: BP 138/84 mmHg  Pulse 72  Ht 4' 10.75" (1.492 m)  Wt 134 lb 6 oz (60.952 kg)  BMI 27.38 kg/m2 General: Well developed white female in no acute distress Head: Normocephalic and atraumatic Eyes:  Sclerae anicteric, conjunctiva pink  Ears: Normal auditory acuity Lungs: Clear throughout to auscultation Heart: Regular rate and rhythm Abdomen: Soft, non-distended.  Normal bowel sounds.  Non-tender. Musculoskeletal: Symmetrical with no gross deformities  Extremities: No edema  Neurological: Alert oriented x 4, grossly non-focal Psychological:  Alert and cooperative. Normal mood and affect  Assessment and Recommendations: -Odynophagia and globus sensation:  ? Due to reflux/esophagitis.  Slightly improved since increasing PPI to twice daily.  She will continue her prevacid twice a day for now, but will also schedule and EGD with Dr. Henrene Pastor for further evaluation.  The risks, benefits, and  alternatives were discussed with the patient and she consents to proceed.

## 2015-04-06 DIAGNOSIS — Z1231 Encounter for screening mammogram for malignant neoplasm of breast: Secondary | ICD-10-CM | POA: Diagnosis not present

## 2015-04-09 ENCOUNTER — Encounter: Payer: Self-pay | Admitting: Internal Medicine

## 2015-04-09 ENCOUNTER — Ambulatory Visit (AMBULATORY_SURGERY_CENTER): Payer: Commercial Managed Care - HMO | Admitting: Internal Medicine

## 2015-04-09 VITALS — BP 134/67 | HR 82 | Temp 96.7°F | Resp 17 | Ht 58.75 in | Wt 134.0 lb

## 2015-04-09 DIAGNOSIS — K222 Esophageal obstruction: Secondary | ICD-10-CM | POA: Diagnosis present

## 2015-04-09 DIAGNOSIS — K219 Gastro-esophageal reflux disease without esophagitis: Secondary | ICD-10-CM

## 2015-04-09 DIAGNOSIS — R1310 Dysphagia, unspecified: Secondary | ICD-10-CM

## 2015-04-09 DIAGNOSIS — Z886 Allergy status to analgesic agent status: Secondary | ICD-10-CM | POA: Diagnosis not present

## 2015-04-09 DIAGNOSIS — I10 Essential (primary) hypertension: Secondary | ICD-10-CM | POA: Diagnosis not present

## 2015-04-09 DIAGNOSIS — E669 Obesity, unspecified: Secondary | ICD-10-CM | POA: Diagnosis not present

## 2015-04-09 MED ORDER — SODIUM CHLORIDE 0.9 % IV SOLN: 500.0000 mL | INTRAVENOUS | Status: DC

## 2015-04-09 NOTE — Progress Notes (Signed)
Report to PACU, RN, vss, BBS= Clear.  

## 2015-04-09 NOTE — Patient Instructions (Addendum)
YOU HAD AN ENDOSCOPIC PROCEDURE TODAY AT [facility]:   Refer to the procedure report that was given to you for any specific questions about what was found during the examination.  If the procedure report does not answer your questions, please call your gastroenterologist to clarify.  If you requested that your care partner not be given the details of your procedure findings, then the procedure report has been included in a sealed envelope for you to review at your convenience later.  YOU SHOULD EXPECT: Some feelings of bloating in the abdomen. Passage of more gas than usual.  Walking can help get rid of the air that was put into your GI tract during the procedure and reduce the bloating. If you had a lower endoscopy (such as a colonoscopy or flexible sigmoidoscopy) you may notice spotting of blood in your stool or on the toilet paper. If you underwent a bowel prep for your procedure, you may not have a normal bowel movement for a few days.  Please Note:  You might notice some irritation and congestion in your nose or some drainage.  This is from the oxygen used during your procedure.  There is no need for concern and it should clear up in a day or so.  SYMPTOMS TO REPORT IMMEDIATELY:  F   Following upper endoscopy (EGD)  Vomiting of blood or coffee ground material  New chest pain or pain under the shoulder blades  Painful or persistently difficult swallowing  New shortness of breath  Fever of 100F or higher  Black, tarry-looking stools  For urgent or emergent issues, a gastroenterologist can be reached at any hour by calling [phone].   DIET: DILATATION DIET GIVEN TO YOU TODAY- LIQUIDS ONLY UNTIL 5PM TODAY THEN SOFT FOODS THE REST OF TODAY,RESUME USUAL DIET TOMORROW  ACTIVITY:  You should plan to take it easy for the rest of today and you should NOT DRIVE or use heavy machinery until tomorrow (because of the sedation medicines used during the test).    FOLLOW  UP: Our staff will call the number listed on your records the next business day following your procedure to check on you and address any questions or concerns that you may have regarding the information given to you following your procedure. If we do not reach you, we will leave a message.  However, if you are feeling well and you are not experiencing any problems, there is no need to return our call.  We will assume that you have returned to your regular daily activities without incident.  If any biopsies were taken you will be contacted by phone or by letter within the next 1-3 weeks.  Please call us at [phone] if you have not heard about the biopsies in 3 weeks.    SIGNATURES/CONFIDENTIALITY: You and/or your care partner have signed paperwork which will be entered into your electronic medical record.  These signatures attest to the fact that that the information above on your After Visit Summary has been reviewed and is understood.  Full responsibility of the confidentiality of this discharge information lies with you and/or your care-partner.   FOLLOW DILATATION DIET INSTRUCTIONS GIVEN TO YOU TODAY   CONTINUE REFLUX PRECAUTIONS   CONTINUE REFLUX MEDICATION TWICE DAILY

## 2015-04-09 NOTE — Op Note (Signed)
[facility]  [address]. [place], [address]   ENDOSCOPY PROCEDURE REPORT  PATIENT: Destiny Bruce, Destiny Bruce  MR#: [id number] BIRTHDATE: April 13, 1936 , 79  yrs. old GENDER: female ENDOSCOPIST: Eustace Quail, MD REFERRED BY:  .  Self / Office PROCEDURE DATE:  04/09/2015 PROCEDURE:  EGD, diagnostic and Maloney dilation of esophagus   -54 ASA CLASS:     Class II INDICATIONS:  dysphagia.  NOTE: Previous problems with globus sensation and esophageal burning sensation have resolved on twice a day PPI. Still with intermittent dysphagia to solids, like bread. MEDICATIONS: Monitored anesthesia care and Propofol 100 mg IV TOPICAL ANESTHETIC: none  DESCRIPTION OF PROCEDURE: After the risks benefits and alternatives of the procedure were thoroughly explained, informed consent was obtained.  The LB [id number] endoscope was introduced through the mouth and advanced to the second portion of the duodenum , Without limitations.  The instrument was slowly withdrawn as the mucosa was fully examined.  The esophagus revealed a ringlike benign stricture at the gastroesophageal junction measuring approximately 15 mm.  This was located at 30 cm from the teeth.  No active inflammation.  Stomach revealed a moderate size sliding hiatal hernia but was otherwise normal.  The duodenum was normal.  Retroflexed views revealed a hiatal hernia.     The scope was then withdrawn from the patient and the procedure completed. THERAPY: 54 French Maloney dilator was passed into the esophagus without resistance or heme. Tolerated well  COMPLICATIONS: There were no immediate complications.  ENDOSCOPIC IMPRESSION: 1. 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 GERD  RECOMMENDATIONS: 1.  Anti-reflux regimen to be followed 2.  Continue PPI (now taking lansoprazole twice daily) 3.  Clear liquids until  IPM, then soft foods rest of day.  Resume prior diet tomorrow. 4. GI follow-up  as needed  REPEAT EXAM:  eSigned:  Eustace Quail, MD 04/09/2015 4:00 PM    CC:The Patient and Dyane Dustman, MD

## 2015-04-09 NOTE — Progress Notes (Signed)
Called to room to assist during endoscopic procedure.  Patient ID and intended procedure confirmed with present staff. Received instructions for my participation in the procedure from the performing physician.  

## 2015-04-10 ENCOUNTER — Telehealth: Payer: Self-pay

## 2015-04-10 NOTE — Telephone Encounter (Signed)
  Follow up Call-  Call back number 04/09/2015 03/14/2014  Post procedure Call Back phone  # [phone]  Permission to leave phone message Yes Yes     Patient questions:  Do you have a fever, pain , or abdominal swelling? No. Pain Score  0 *  Have you tolerated food without any problems? Yes.    Have you been able to return to your normal activities? Yes.    Do you have any questions about your discharge instructions: Diet   No. Medications  No. Follow up visit  No.  Do you have questions or concerns about your Care? No.  Actions: * If pain score is 4 or above: No action needed, pain <4.

## 2015-06-25 DIAGNOSIS — Z23 Encounter for immunization: Secondary | ICD-10-CM | POA: Diagnosis not present

## 2015-07-27 DIAGNOSIS — R42 Dizziness and giddiness: Secondary | ICD-10-CM | POA: Diagnosis not present

## 2015-07-27 DIAGNOSIS — I10 Essential (primary) hypertension: Secondary | ICD-10-CM | POA: Diagnosis not present

## 2015-07-27 DIAGNOSIS — R51 Headache: Secondary | ICD-10-CM | POA: Diagnosis not present

## 2015-09-03 DIAGNOSIS — R351 Nocturia: Secondary | ICD-10-CM | POA: Diagnosis not present

## 2015-09-03 DIAGNOSIS — E7800 Pure hypercholesterolemia, unspecified: Secondary | ICD-10-CM | POA: Diagnosis not present

## 2015-09-03 DIAGNOSIS — R634 Abnormal weight loss: Secondary | ICD-10-CM | POA: Diagnosis not present

## 2015-09-03 DIAGNOSIS — I10 Essential (primary) hypertension: Secondary | ICD-10-CM | POA: Diagnosis not present

## 2015-09-03 DIAGNOSIS — E119 Type 2 diabetes mellitus without complications: Secondary | ICD-10-CM | POA: Diagnosis not present

## 2015-09-03 DIAGNOSIS — R42 Dizziness and giddiness: Secondary | ICD-10-CM | POA: Diagnosis not present

## 2015-09-03 DIAGNOSIS — M810 Age-related osteoporosis without current pathological fracture: Secondary | ICD-10-CM | POA: Diagnosis not present

## 2015-09-03 DIAGNOSIS — Z0000 Encounter for general adult medical examination without abnormal findings: Secondary | ICD-10-CM | POA: Diagnosis not present

## 2015-09-24 ENCOUNTER — Encounter: Payer: Self-pay | Admitting: Internal Medicine

## 2015-10-05 DIAGNOSIS — E7800 Pure hypercholesterolemia, unspecified: Secondary | ICD-10-CM | POA: Diagnosis not present

## 2015-10-05 DIAGNOSIS — I10 Essential (primary) hypertension: Secondary | ICD-10-CM | POA: Diagnosis not present

## 2015-10-05 DIAGNOSIS — H5200 Hypermetropia, unspecified eye: Secondary | ICD-10-CM | POA: Diagnosis not present

## 2015-10-05 DIAGNOSIS — H5210 Myopia, unspecified eye: Secondary | ICD-10-CM | POA: Diagnosis not present

## 2015-10-05 DIAGNOSIS — H2513 Age-related nuclear cataract, bilateral: Secondary | ICD-10-CM | POA: Diagnosis not present

## 2015-10-06 DIAGNOSIS — H524 Presbyopia: Secondary | ICD-10-CM | POA: Diagnosis not present

## 2015-10-06 DIAGNOSIS — H52209 Unspecified astigmatism, unspecified eye: Secondary | ICD-10-CM | POA: Diagnosis not present

## 2015-10-06 DIAGNOSIS — H5203 Hypermetropia, bilateral: Secondary | ICD-10-CM | POA: Diagnosis not present

## 2015-10-06 DIAGNOSIS — Z0100 Encounter for examination of eyes and vision without abnormal findings: Secondary | ICD-10-CM | POA: Diagnosis not present

## 2015-10-12 DIAGNOSIS — M810 Age-related osteoporosis without current pathological fracture: Secondary | ICD-10-CM | POA: Diagnosis not present

## 2015-10-12 DIAGNOSIS — Z780 Asymptomatic menopausal state: Secondary | ICD-10-CM | POA: Diagnosis not present

## 2015-10-15 DIAGNOSIS — R799 Abnormal finding of blood chemistry, unspecified: Secondary | ICD-10-CM | POA: Diagnosis not present

## 2015-11-16 DIAGNOSIS — Z8262 Family history of osteoporosis: Secondary | ICD-10-CM | POA: Diagnosis not present

## 2015-11-16 DIAGNOSIS — M810 Age-related osteoporosis without current pathological fracture: Secondary | ICD-10-CM | POA: Diagnosis not present

## 2015-11-16 DIAGNOSIS — Z1321 Encounter for screening for nutritional disorder: Secondary | ICD-10-CM | POA: Diagnosis not present

## 2015-11-16 DIAGNOSIS — Z79899 Other long term (current) drug therapy: Secondary | ICD-10-CM | POA: Diagnosis not present

## 2015-11-16 DIAGNOSIS — Z8781 Personal history of (healed) traumatic fracture: Secondary | ICD-10-CM | POA: Diagnosis not present

## 2015-12-09 DIAGNOSIS — Z1321 Encounter for screening for nutritional disorder: Secondary | ICD-10-CM | POA: Diagnosis not present

## 2015-12-09 DIAGNOSIS — Z79899 Other long term (current) drug therapy: Secondary | ICD-10-CM | POA: Diagnosis not present

## 2015-12-09 DIAGNOSIS — M810 Age-related osteoporosis without current pathological fracture: Secondary | ICD-10-CM | POA: Diagnosis not present

## 2016-03-04 DIAGNOSIS — M170 Bilateral primary osteoarthritis of knee: Secondary | ICD-10-CM | POA: Diagnosis not present

## 2016-03-04 DIAGNOSIS — E7800 Pure hypercholesterolemia, unspecified: Secondary | ICD-10-CM | POA: Diagnosis not present

## 2016-03-04 DIAGNOSIS — K219 Gastro-esophageal reflux disease without esophagitis: Secondary | ICD-10-CM | POA: Diagnosis not present

## 2016-03-04 DIAGNOSIS — I10 Essential (primary) hypertension: Secondary | ICD-10-CM | POA: Diagnosis not present

## 2016-03-25 DIAGNOSIS — E871 Hypo-osmolality and hyponatremia: Secondary | ICD-10-CM | POA: Diagnosis not present

## 2016-03-28 DIAGNOSIS — Z8781 Personal history of (healed) traumatic fracture: Secondary | ICD-10-CM | POA: Diagnosis not present

## 2016-03-28 DIAGNOSIS — M818 Other osteoporosis without current pathological fracture: Secondary | ICD-10-CM | POA: Diagnosis not present

## 2016-03-28 DIAGNOSIS — Z8262 Family history of osteoporosis: Secondary | ICD-10-CM | POA: Diagnosis not present

## 2016-03-28 DIAGNOSIS — Z79899 Other long term (current) drug therapy: Secondary | ICD-10-CM | POA: Diagnosis not present

## 2016-04-13 DIAGNOSIS — Z1231 Encounter for screening mammogram for malignant neoplasm of breast: Secondary | ICD-10-CM | POA: Diagnosis not present

## 2016-05-14 ENCOUNTER — Encounter (HOSPITAL_BASED_OUTPATIENT_CLINIC_OR_DEPARTMENT_OTHER): Payer: Self-pay | Admitting: Emergency Medicine

## 2016-05-14 ENCOUNTER — Inpatient Hospital Stay (HOSPITAL_BASED_OUTPATIENT_CLINIC_OR_DEPARTMENT_OTHER)
Admission: EM | Admit: 2016-05-14 | Discharge: 2016-05-16 | DRG: 641 | Disposition: A | Discharge status: Home / Self Care | Payer: Commercial Managed Care - HMO | Attending: Internal Medicine | Admitting: Internal Medicine

## 2016-05-14 DIAGNOSIS — M810 Age-related osteoporosis without current pathological fracture: Secondary | ICD-10-CM | POA: Diagnosis not present

## 2016-05-14 DIAGNOSIS — Z7982 Long term (current) use of aspirin: Secondary | ICD-10-CM | POA: Diagnosis not present

## 2016-05-14 DIAGNOSIS — N3001 Acute cystitis with hematuria: Secondary | ICD-10-CM | POA: Diagnosis not present

## 2016-05-14 DIAGNOSIS — E785 Hyperlipidemia, unspecified: Secondary | ICD-10-CM | POA: Diagnosis not present

## 2016-05-14 DIAGNOSIS — I10 Essential (primary) hypertension: Secondary | ICD-10-CM | POA: Diagnosis present

## 2016-05-14 DIAGNOSIS — M40209 Unspecified kyphosis, site unspecified: Secondary | ICD-10-CM | POA: Diagnosis not present

## 2016-05-14 DIAGNOSIS — M1990 Unspecified osteoarthritis, unspecified site: Secondary | ICD-10-CM | POA: Diagnosis present

## 2016-05-14 DIAGNOSIS — K5900 Constipation, unspecified: Secondary | ICD-10-CM | POA: Diagnosis present

## 2016-05-14 DIAGNOSIS — R531 Weakness: Secondary | ICD-10-CM

## 2016-05-14 DIAGNOSIS — E860 Dehydration: Secondary | ICD-10-CM | POA: Diagnosis present

## 2016-05-14 DIAGNOSIS — R350 Frequency of micturition: Secondary | ICD-10-CM | POA: Diagnosis not present

## 2016-05-14 DIAGNOSIS — R112 Nausea with vomiting, unspecified: Secondary | ICD-10-CM | POA: Diagnosis not present

## 2016-05-14 DIAGNOSIS — N390 Urinary tract infection, site not specified: Secondary | ICD-10-CM | POA: Diagnosis not present

## 2016-05-14 DIAGNOSIS — D72829 Elevated white blood cell count, unspecified: Secondary | ICD-10-CM

## 2016-05-14 DIAGNOSIS — E871 Hypo-osmolality and hyponatremia: Secondary | ICD-10-CM | POA: Diagnosis not present

## 2016-05-14 LAB — CBC WITH DIFFERENTIAL/PLATELET
BASOS PCT: 0 %
Basophils Absolute: 0 10*3/uL (ref 0.0–0.1)
Eosinophils Absolute: 0.1 10*3/uL (ref 0.0–0.7)
Eosinophils Relative: 1 %
HEMATOCRIT: 34.4 % — AB (ref 36.0–46.0)
HEMOGLOBIN: 12 g/dL (ref 12.0–15.0)
LYMPHS ABS: 1.4 10*3/uL (ref 0.7–4.0)
LYMPHS PCT: 13 %
MCH: 31.1 pg (ref 26.0–34.0)
MCHC: 34.9 g/dL (ref 30.0–36.0)
MCV: 89.1 fL (ref 78.0–100.0)
MONO ABS: 1.1 10*3/uL — AB (ref 0.1–1.0)
MONOS PCT: 10 %
NEUTROS ABS: 8.5 10*3/uL — AB (ref 1.7–7.7)
NEUTROS PCT: 76 %
Platelets: 245 10*3/uL (ref 150–400)
RBC: 3.86 MIL/uL — ABNORMAL LOW (ref 3.87–5.11)
RDW: 12.8 % (ref 11.5–15.5)
WBC: 11.1 10*3/uL — ABNORMAL HIGH (ref 4.0–10.5)

## 2016-05-14 LAB — URINALYSIS, ROUTINE W REFLEX MICROSCOPIC
BILIRUBIN URINE: NEGATIVE
Glucose, UA: NEGATIVE mg/dL
Hgb urine dipstick: NEGATIVE
Ketones, ur: NEGATIVE mg/dL
NITRITE: NEGATIVE
PROTEIN: NEGATIVE mg/dL
SPECIFIC GRAVITY, URINE: 1.007 (ref 1.005–1.030)
pH: 7 (ref 5.0–8.0)

## 2016-05-14 LAB — COMPREHENSIVE METABOLIC PANEL
ALBUMIN: 4 g/dL (ref 3.5–5.0)
ALK PHOS: 49 U/L (ref 38–126)
ALT: 17 U/L (ref 14–54)
ANION GAP: 10 (ref 5–15)
AST: 24 U/L (ref 15–41)
BUN: 8 mg/dL (ref 6–20)
CALCIUM: 8.9 mg/dL (ref 8.9–10.3)
CO2: 21 mmol/L — ABNORMAL LOW (ref 22–32)
CREATININE: 0.48 mg/dL (ref 0.44–1.00)
Chloride: 85 mmol/L — ABNORMAL LOW (ref 101–111)
GFR calc Af Amer: >60 mL/min (ref >60)
GFR calc non Af Amer: >60 mL/min (ref >60)
Glucose, Bld: 113 mg/dL — ABNORMAL HIGH (ref 65–99)
Potassium: 4.3 mmol/L (ref 3.5–5.1)
SODIUM: 116 mmol/L — AB (ref 135–145)
TOTAL PROTEIN: 6.8 g/dL (ref 6.5–8.1)
Total Bilirubin: 0.6 mg/dL (ref 0.3–1.2)

## 2016-05-14 LAB — BASIC METABOLIC PANEL
Anion gap: 9 (ref 5–15)
BUN: 8 mg/dL (ref 6–20)
CALCIUM: 8.6 mg/dL — AB (ref 8.9–10.3)
CHLORIDE: 86 mmol/L — AB (ref 101–111)
CO2: 22 mmol/L (ref 22–32)
CREATININE: 0.55 mg/dL (ref 0.44–1.00)
GFR calc non Af Amer: >60 mL/min (ref >60)
GLUCOSE: 109 mg/dL — AB (ref 65–99)
Potassium: 4.5 mmol/L (ref 3.5–5.1)
Sodium: 117 mmol/L — CL (ref 135–145)

## 2016-05-14 LAB — I-STAT CG4 LACTIC ACID, ED: LACTIC ACID, VENOUS: 1.07 mmol/L (ref 0.5–1.9)

## 2016-05-14 LAB — LIPASE, BLOOD: LIPASE: 27 U/L (ref 11–51)

## 2016-05-14 LAB — URINE MICROSCOPIC-ADD ON
RBC / HPF: NONE SEEN RBC/hpf (ref 0–5)
SQUAMOUS EPITHELIAL / LPF: NONE SEEN

## 2016-05-14 MED ORDER — DEXTROSE 5 % IV SOLN
1.0000 g | Freq: Once | INTRAVENOUS | Status: AC
  Administered 2016-05-14: 1 g via INTRAVENOUS
  Filled 2016-05-14: qty 10

## 2016-05-14 MED ORDER — SODIUM CHLORIDE 0.9 % IV SOLN
Freq: Once | INTRAVENOUS | Status: AC
  Administered 2016-05-14: 22:00:00 via INTRAVENOUS

## 2016-05-14 NOTE — Progress Notes (Signed)
This is a no charge note  Transfer from [facility] per Dr. Rex Kras  80 year old lady with past medical history of hypertension, hyperlipidemia, GERD, esophageal stricture, hiatal hernia, osteoarthritis, osteoporosis, kyphosis, who presents with nausea, vomiting.   Patient was diagnosed as UTI at urgent care in AM. She was told that if she develops of N/V, will need to come to hospital. Patient now reports nausea and vomiting. She was found to have hyponatremia with sodium of 116. Pt states that she has history of low sodium, but never been hospitalized for it. Per ED physician, patient is mildly confused intermittently, but per family, patient's mental status is at baseline. IV NS at 75 cc/h was started in ED. Pt is on lisinopril and HCTZ.  Urinalysis with small amount of leukocytes, WBC 11.1, lactate 1.07, temperature normal, blood pressure 158/88, no tachycardia, no tachypnea, oxygen saturation 99% on room air, potassium normal, creatinine normal. Rocephin IV was given. Pt is accepted to tele bed for obs.  Ivor Costa, MD  [facility] Pager [phone]  If 7PM-7AM, please contact night-coverage [email or web address] Password [email or web address] 05/14/2016, 11:06 PM

## 2016-05-14 NOTE — ED Provider Notes (Signed)
[facility] DEPT [facility] Provider Note   CSN: [id number] Arrival date & time: 05/14/16  1910  By signing my name below, I, Destiny Bruce, attest that this documentation has been prepared under the direction and in the presence of Sharlett Iles, MD . Electronically Signed: [name], Scribe. 05/14/2016. 9:30 PM.    History   Chief Complaint Chief Complaint  Patient presents with  . Dysuria     The history is provided by the patient. No language interpreter was used.   HPI Comments:  Destiny Bruce is a 80 y.o. female who presents to the Emergency Department complaining of  dysuria this AM. Pt went to Urgent Care this AM to be treated for UTI; she was given abx and was told to go to ED if she began having N/V. Pt vomited today after eating lunch but has had no subsequent episodes. Pt complains of associated indigestion, nausea, and sore throat from acid. Pt has PMHx of similar dysuria associated with UTI as well as GERD. Pt states that she feels weak since she has not eaten since lunch. Pt was able to drink water after vomiting. Pt notes that she feels weak when standing.  Pt denies any other pain, diarrhea, fever, cough, rhinorrhea. Pt denies any current nausea.   Family has not noticed any confusion recently.  Past Medical History:  Diagnosis Date  . Arthritis   . GERD (gastroesophageal reflux disease) 2008  . Hiatal hernia 2008  . Hx: UTI (urinary tract infection)   . Hyperlipemia   . Hypertension   . Kyphosis   . Osteoporosis   . Stricture and stenosis of esophagus 2008    Patient Active Problem List   Diagnosis Date Noted  . Odynophagia 03/31/2015  . Globus sensation 03/31/2015  . History of esophageal stricture 03/31/2015  . TINEA CRURIS 03/22/2010  . CELLULITIS, ARM 05/26/2009    Past Surgical History:  Procedure Laterality Date  . BREAST SURGERY     Cyst removed   . HIP SURGERY    . TUBAL LIGATION      OB History    No data available       Home Medications    Prior to Admission medications   Medication Sig Start Date End Date Taking? Authorizing Provider  aspirin (ECOTRIN LOW STRENGTH) 81 MG EC tablet Take 81 mg by mouth daily. Swallow whole.    Historical Provider, MD  Calcium-Vitamin A-Vitamin D (LIQUID CALCIUM PO) Take by mouth as directed.    Historical Provider, MD  Cholecalciferol (VITAMIN D3) 2000 UNITS TABS Take 1 tablet by mouth daily.    Historical Provider, MD  Coenzyme Q10 (COQ10 PO) Take 1 capsule by mouth daily.    Historical Provider, MD  Cyanocobalamin (VITAMIN B-12 PO) Take 1 capsule by mouth daily.    Historical Provider, MD  hydrochlorothiazide (MICROZIDE) 12.5 MG capsule  02/05/13   Historical Provider, MD  lansoprazole (PREVACID) 15 MG capsule Take 15 mg by mouth.    Historical Provider, MD  lisinopril (PRINIVIL,ZESTRIL) 20 MG tablet Take 20 mg by mouth daily.    Historical Provider, MD  metoprolol tartrate (LOPRESSOR) 25 MG tablet Take 25 mg by mouth daily.    Historical Provider, MD  simvastatin (ZOCOR) 10 MG tablet Take 10 mg by mouth at bedtime.    Historical Provider, MD  VITAMIN E PO Take 1 capsule by mouth daily.    Historical Provider, MD    Family History Family History  Problem Relation Age of Onset  .  GER disease Mother   . Heart failure Father   . Cancer Maternal Grandfather   . CAD Maternal Uncle   . Cancer Paternal Aunt     "female cancer"  . Prostate cancer Maternal Uncle   . Leukemia Paternal Uncle   . Lung cancer Brother   . Heart disease Sister   . Colon cancer Neg Hx   . Esophageal cancer Neg Hx   . Stomach cancer Neg Hx     Social History Social History  Substance Use Topics  . Smoking status: Never Smoker  . Smokeless tobacco: Never Used  . Alcohol use No     Allergies   Codeine   Review of Systems Review of Systems 10 Systems reviewed and are negative for acute change except as noted in the HPI.   Physical Exam Updated Vital Signs BP 157/80    Pulse 82   Temp 97.9 F (36.6 C) (Oral)   Resp 18   Ht 5\' 1"  (1.549 m)   Wt 136 lb (61.7 kg)   SpO2 98%   BMI 25.70 kg/m   Physical Exam  Constitutional: No distress.  Frail, elderly woman awake and alert  HENT:  Head: Normocephalic and atraumatic.  Moist mucous membranes  Eyes: Conjunctivae are normal.  Neck: Neck supple.  Cardiovascular: Normal rate, regular rhythm and normal heart sounds.   No murmur heard. Pulmonary/Chest: Effort normal and breath sounds normal.  Abdominal: Soft. Bowel sounds are normal. She exhibits no distension. There is no tenderness.  Musculoskeletal: She exhibits edema (mild BLE).  Neurological: She is alert.  Oriented to person and place, occasional confusion during conversation but able to answer questions and follow commands  Skin: Skin is warm and dry.  Psychiatric: She has a normal mood and affect.  Nursing note and vitals reviewed.    ED Treatments / Results  DIAGNOSTIC STUDIES:  Oxygen Saturation is 100% on RA, normal by my interpretation.    COORDINATION OF CARE:  9:30 PM Discussed treatment plan with pt at bedside and pt agreed to plan.  Labs (all labs ordered are listed, but only abnormal results are displayed) Labs Reviewed  URINALYSIS, ROUTINE W REFLEX MICROSCOPIC (NOT AT [facility]) - Abnormal; Notable for the following:       Result Value   APPearance CLOUDY (*)    Leukocytes, UA SMALL (*)    All other components within normal limits  COMPREHENSIVE METABOLIC PANEL - Abnormal; Notable for the following:    Sodium 116 (*)    Chloride 85 (*)    CO2 21 (*)    Glucose, Bld 113 (*)    All other components within normal limits  URINE MICROSCOPIC-ADD ON - Abnormal; Notable for the following:    Bacteria, UA FEW (*)    All other components within normal limits  CBC WITH DIFFERENTIAL/PLATELET - Abnormal; Notable for the following:    WBC 11.1 (*)    RBC 3.86 (*)    HCT 34.4 (*)    Neutro Abs 8.5 (*)    Monocytes Absolute 1.1 (*)      All other components within normal limits  BASIC METABOLIC PANEL - Abnormal; Notable for the following:    Sodium 117 (*)    Chloride 86 (*)    Glucose, Bld 109 (*)    Calcium 8.6 (*)    All other components within normal limits  URINE CULTURE  LIPASE, BLOOD  I-STAT CG4 LACTIC ACID, ED    EKG  EKG Interpretation  Date/Time:  Saturday May 14 2016 19:38:12 EDT Ventricular Rate:  70 PR Interval:  162 QRS Duration: 78 QT Interval:  390 QTC Calculation: 421 R Axis:   23 Text Interpretation:  Normal sinus rhythm Normal ECG No previous ECGs available Confirmed by Joclyn Alsobrook MD, Venson Ferencz [id number]) on 05/14/2016 8:57:11 PM       Radiology No results found.  Procedures .Critical Care Performed by: Sharlett Iles Authorized by: Sharlett Iles   Critical care provider statement:    Critical care time (minutes):  30   Critical care time was exclusive of:  Separately billable procedures and treating other patients   Critical care was necessary to treat or prevent imminent or life-threatening deterioration of the following conditions: hyponatremia.   Critical care was time spent personally by me on the following activities:  Development of treatment plan with patient or surrogate, evaluation of patient's response to treatment, examination of patient, obtaining history from patient or surrogate, ordering and performing treatments and interventions, ordering and review of laboratory studies, re-evaluation of patient's condition and review of old charts   (including critical care time)  Medications Ordered in ED Medications  0.9 %  sodium chloride infusion (not administered)  cefTRIAXone (ROCEPHIN) 1 g in dextrose 5 % 50 mL IVPB (1 g Intravenous New Bag/Given 05/14/16 2146)     Initial Impression / Assessment and Plan / ED Course  I have reviewed the triage vital signs and the nursing notes.  Pertinent labs that were available during my care of the patient were reviewed  by me and considered in my medical decision making (see chart for details).  Clinical Course   Patient presents after an episode of vomiting today, recent diagnosis of urinary tract infection. She was awake and alert, comfortable on exam. Vital signs unremarkable. Occasional confusion during conversation but otherwise she was able to answer questions. Obtained lab work because of vomiting which showed sodium 116, chloride 85, CO2 21. Lactate normal. UA from this morning was nitrite positive, therefore gave ceftriaxone. Repeat sodium is 117. I asked patient about sodium issues and she states that she has had problems with low sodium in the past but has never had to be hospitalized. No seizure activity or confusion according to family. Began maintenance fluids and discussed admission with hospitalist, Dr. Blaine Hamper. Pt transferred to [facility] for further treatment of hyponatremia.  Final Clinical Impressions(s) / ED Diagnoses   Final diagnoses:  None    New Prescriptions New Prescriptions   No medications on file  I personally performed the services described in this documentation, which was scribed in my presence. The recorded information has been reviewed and is accurate.     Sharlett Iles, MD 05/14/16 [phone]

## 2016-05-14 NOTE — ED Notes (Signed)
MD at bedside. 

## 2016-05-14 NOTE — ED Triage Notes (Signed)
Patient went to the urgent care this am seen and treated for a UTI  - she was told that if she develops of and N/V to come to the er -  patient now reports that she has ingestion, N/V Nausea and Vomiting

## 2016-05-15 ENCOUNTER — Observation Stay (HOSPITAL_COMMUNITY): Payer: Commercial Managed Care - HMO

## 2016-05-15 ENCOUNTER — Encounter (HOSPITAL_COMMUNITY): Payer: Self-pay

## 2016-05-15 DIAGNOSIS — D72829 Elevated white blood cell count, unspecified: Secondary | ICD-10-CM

## 2016-05-15 DIAGNOSIS — E860 Dehydration: Secondary | ICD-10-CM | POA: Diagnosis present

## 2016-05-15 DIAGNOSIS — R112 Nausea with vomiting, unspecified: Secondary | ICD-10-CM | POA: Diagnosis not present

## 2016-05-15 DIAGNOSIS — M810 Age-related osteoporosis without current pathological fracture: Secondary | ICD-10-CM | POA: Diagnosis present

## 2016-05-15 DIAGNOSIS — Z7982 Long term (current) use of aspirin: Secondary | ICD-10-CM | POA: Diagnosis not present

## 2016-05-15 DIAGNOSIS — I10 Essential (primary) hypertension: Secondary | ICD-10-CM

## 2016-05-15 DIAGNOSIS — M40209 Unspecified kyphosis, site unspecified: Secondary | ICD-10-CM | POA: Diagnosis present

## 2016-05-15 DIAGNOSIS — M1990 Unspecified osteoarthritis, unspecified site: Secondary | ICD-10-CM | POA: Diagnosis present

## 2016-05-15 DIAGNOSIS — E871 Hypo-osmolality and hyponatremia: Principal | ICD-10-CM

## 2016-05-15 DIAGNOSIS — N390 Urinary tract infection, site not specified: Secondary | ICD-10-CM

## 2016-05-15 DIAGNOSIS — E785 Hyperlipidemia, unspecified: Secondary | ICD-10-CM | POA: Diagnosis present

## 2016-05-15 DIAGNOSIS — K5900 Constipation, unspecified: Secondary | ICD-10-CM | POA: Diagnosis present

## 2016-05-15 LAB — CBC WITH DIFFERENTIAL/PLATELET
Basophils Absolute: 0 10*3/uL (ref 0.0–0.1)
Basophils Relative: 0 %
EOS ABS: 0.1 10*3/uL (ref 0.0–0.7)
EOS PCT: 1 %
HCT: 37.5 % (ref 36.0–46.0)
Hemoglobin: 12.2 g/dL (ref 12.0–15.0)
LYMPHS ABS: 1.7 10*3/uL (ref 0.7–4.0)
LYMPHS PCT: 19 %
MCH: 30.3 pg (ref 26.0–34.0)
MCHC: 32.5 g/dL (ref 30.0–36.0)
MCV: 93.3 fL (ref 78.0–100.0)
MONO ABS: 1.2 10*3/uL — AB (ref 0.1–1.0)
Monocytes Relative: 14 %
Neutro Abs: 5.7 10*3/uL (ref 1.7–7.7)
Neutrophils Relative %: 66 %
PLATELETS: 238 10*3/uL (ref 150–400)
RBC: 4.02 MIL/uL (ref 3.87–5.11)
RDW: 13.2 % (ref 11.5–15.5)
WBC: 8.7 10*3/uL (ref 4.0–10.5)

## 2016-05-15 LAB — BASIC METABOLIC PANEL
Anion gap: 9 (ref 5–15)
Anion gap: 8 (ref 5–15)
BUN: 6 mg/dL (ref 6–20)
CALCIUM: 8.9 mg/dL (ref 8.9–10.3)
CHLORIDE: 90 mmol/L — AB (ref 101–111)
CO2: 20 mmol/L — ABNORMAL LOW (ref 22–32)
CO2: 22 mmol/L (ref 22–32)
CREATININE: 0.65 mg/dL (ref 0.44–1.00)
CREATININE: 0.6 mg/dL (ref 0.44–1.00)
Calcium: 8.4 mg/dL — ABNORMAL LOW (ref 8.9–10.3)
Chloride: 100 mmol/L — ABNORMAL LOW (ref 101–111)
GFR calc Af Amer: >60 mL/min (ref >60)
GFR calc Af Amer: >60 mL/min (ref >60)
GLUCOSE: 100 mg/dL — AB (ref 65–99)
GLUCOSE: 89 mg/dL (ref 65–99)
POTASSIUM: 3.8 mmol/L (ref 3.5–5.1)
Potassium: 4.4 mmol/L (ref 3.5–5.1)
SODIUM: 119 mmol/L — AB (ref 135–145)
SODIUM: 130 mmol/L — AB (ref 135–145)

## 2016-05-15 LAB — OSMOLALITY, URINE: OSMOLALITY UR: 118 mosm/kg — AB (ref 300–900)

## 2016-05-15 LAB — TSH: TSH: 1.553 u[IU]/mL (ref 0.350–4.500)

## 2016-05-15 LAB — SODIUM, URINE, RANDOM: SODIUM UR: 42 mmol/L

## 2016-05-15 LAB — OSMOLALITY: Osmolality: 257 mOsm/kg — ABNORMAL LOW (ref 275–295)

## 2016-05-15 MED ORDER — ACETAMINOPHEN 325 MG PO TABS: 650.0000 mg | ORAL_TABLET | Freq: Four times a day (QID) | ORAL | Status: DC | PRN

## 2016-05-15 MED ORDER — SIMVASTATIN 20 MG PO TABS
10.0000 mg | ORAL_TABLET | Freq: Every day | ORAL | Status: DC
  Administered 2016-05-15: 10 mg via ORAL
  Filled 2016-05-15: qty 1

## 2016-05-15 MED ORDER — DOCUSATE SODIUM 100 MG PO CAPS
200.0000 mg | ORAL_CAPSULE | Freq: Two times a day (BID) | ORAL | Status: DC
  Administered 2016-05-15 – 2016-05-16 (×3): 200 mg via ORAL
  Filled 2016-05-15 (×3): qty 2

## 2016-05-15 MED ORDER — SODIUM CHLORIDE 0.9% FLUSH
3.0000 mL | Freq: Two times a day (BID) | INTRAVENOUS | Status: DC
  Administered 2016-05-15 – 2016-05-16 (×2): 3 mL via INTRAVENOUS

## 2016-05-15 MED ORDER — HYDRALAZINE HCL 20 MG/ML IJ SOLN: 20.0000 mg | Freq: Four times a day (QID) | INTRAMUSCULAR | Status: DC | PRN

## 2016-05-15 MED ORDER — METOPROLOL TARTRATE 25 MG PO TABS
25.0000 mg | ORAL_TABLET | Freq: Every day | ORAL | Status: DC
  Administered 2016-05-15 – 2016-05-16 (×2): 25 mg via ORAL
  Filled 2016-05-15 (×2): qty 1

## 2016-05-15 MED ORDER — SODIUM CHLORIDE 0.9 % IV SOLN
INTRAVENOUS | Status: AC
  Administered 2016-05-15 (×2): via INTRAVENOUS

## 2016-05-15 MED ORDER — SODIUM CHLORIDE 0.9 % IV SOLN: INTRAVENOUS | Status: DC

## 2016-05-15 MED ORDER — BISACODYL 10 MG RE SUPP
10.0000 mg | Freq: Every day | RECTAL | Status: DC
  Filled 2016-05-15 (×2): qty 1

## 2016-05-15 MED ORDER — ACETAMINOPHEN 650 MG RE SUPP: 650.0000 mg | Freq: Four times a day (QID) | RECTAL | Status: DC | PRN

## 2016-05-15 MED ORDER — ASPIRIN EC 81 MG PO TBEC
81.0000 mg | DELAYED_RELEASE_TABLET | Freq: Every day | ORAL | Status: DC
Start: 2016-05-15 — End: 2016-05-16
  Administered 2016-05-15 – 2016-05-16 (×2): 81 mg via ORAL
  Filled 2016-05-15 (×2): qty 1

## 2016-05-15 MED ORDER — AMLODIPINE BESYLATE 5 MG PO TABS
5.0000 mg | ORAL_TABLET | Freq: Every day | ORAL | Status: DC
  Administered 2016-05-15 – 2016-05-16 (×2): 5 mg via ORAL
  Filled 2016-05-15 (×2): qty 1

## 2016-05-15 MED ORDER — ONDANSETRON HCL 4 MG PO TABS: 4.0000 mg | ORAL_TABLET | Freq: Four times a day (QID) | ORAL | Status: DC | PRN

## 2016-05-15 MED ORDER — DEXTROSE 5 % IV SOLN
1.0000 g | INTRAVENOUS | Status: DC
  Administered 2016-05-15: 1 g via INTRAVENOUS
  Filled 2016-05-15 (×2): qty 10

## 2016-05-15 MED ORDER — ONDANSETRON HCL 4 MG/2ML IJ SOLN: 4.0000 mg | Freq: Four times a day (QID) | INTRAMUSCULAR | Status: DC | PRN

## 2016-05-15 MED ORDER — PANTOPRAZOLE SODIUM 20 MG PO TBEC
20.0000 mg | DELAYED_RELEASE_TABLET | Freq: Every day | ORAL | Status: DC
  Administered 2016-05-15 – 2016-05-16 (×2): 20 mg via ORAL
  Filled 2016-05-15 (×2): qty 1

## 2016-05-15 MED ORDER — HEPARIN SODIUM (PORCINE) 5000 UNIT/ML IJ SOLN
5000.0000 [IU] | Freq: Three times a day (TID) | INTRAMUSCULAR | Status: DC
  Administered 2016-05-15 – 2016-05-16 (×3): 5000 [IU] via SUBCUTANEOUS
  Filled 2016-05-15 (×3): qty 1

## 2016-05-15 MED ORDER — SODIUM CHLORIDE 0.9 % IV SOLN
INTRAVENOUS | Status: DC
  Administered 2016-05-15: 04:00:00 via INTRAVENOUS

## 2016-05-15 NOTE — H&P (Signed)
History and Physical    Destiny Bruce [id number] DOB: 07/25/1936 DOA: 05/14/2016  PCP: Finis Bud, MD   Patient coming from: ED in [facility]  Chief Complaint: Nausea and vomiting  HPI: Destiny Bruce is a 80 y.o. woman with a history of HTN, HLD, GERD, and esophageal stricture who developed lower urinary tract symptoms on Thursday.  She was seen at an urgent care facility in [place] on Friday and was given a prescription for Macrobid.  She reports that she only took one dose on Friday night.  By Saturday morning, she was feeling worse with mental status changes, weakness, muscle aches, and nausea and vomiting.  She has had light-headedness but no syncope.  She denies abdominal pain or back pain.  No chest pain or shortness of breath.  She reported to the ED in [facility], where she was found to have a sodium level of 116.  She has reportedly had low sodium levels in the past, but she is not sure how low.  No recent changes to her home medications except for new antibiotic prescribed two days ago.  She says that she has been drinking a lot water to "flush her kidneys".  ED Course: Urine culture sent.  IV Rocephin given.  Patient started on NS at 75cc/hr.  She was transferred to [facility] for admission.  Review of Systems: As per HPI otherwise 10 point review of systems negative.    Past Medical History:  Diagnosis Date  . Arthritis   . GERD (gastroesophageal reflux disease) 2008  . Hiatal hernia 2008  . Hx: UTI (urinary tract infection)   . Hyperlipemia   . Hypertension   . Kyphosis   . Osteoporosis   . Stricture and stenosis of esophagus 2008    Past Surgical History:  Procedure Laterality Date  . BREAST SURGERY     Cyst removed   . HIP SURGERY    . TUBAL LIGATION       reports that she has never smoked. She has never used smokeless tobacco. She reports that she does not drink alcohol or use drugs.  She is a widow.  She has three adult children.  She  still lives independently.  Allergies  Allergen Reactions  . Codeine     Family History  Problem Relation Age of Onset  . GER disease Mother   . Heart failure Father   . Cancer Maternal Grandfather   . CAD Maternal Uncle   . Cancer Paternal Aunt     "female cancer"  . Prostate cancer Maternal Uncle   . Leukemia Paternal Uncle   . Lung cancer Brother   . Heart disease Sister   . Colon cancer Neg Hx   . Esophageal cancer Neg Hx   . Stomach cancer Neg Hx      Prior to Admission medications   Medication Sig Start Date End Date Taking? Authorizing Provider  aspirin (ECOTRIN LOW STRENGTH) 81 MG EC tablet Take 81 mg by mouth daily. Swallow whole.    Historical Provider, MD  Calcium-Vitamin A-Vitamin D (LIQUID CALCIUM PO) Take by mouth as directed.    Historical Provider, MD  Cholecalciferol (VITAMIN D3) 2000 UNITS TABS Take 1 tablet by mouth daily.    Historical Provider, MD  Coenzyme Q10 (COQ10 PO) Take 1 capsule by mouth daily.    Historical Provider, MD  Cyanocobalamin (VITAMIN B-12 PO) Take 1 capsule by mouth daily.    Historical Provider, MD  hydrochlorothiazide (MICROZIDE) 12.5 MG capsule  02/05/13  Historical Provider, MD  lansoprazole (PREVACID) 15 MG capsule Take 15 mg by mouth.    Historical Provider, MD  lisinopril (PRINIVIL,ZESTRIL) 20 MG tablet Take 20 mg by mouth daily.    Historical Provider, MD  metoprolol tartrate (LOPRESSOR) 25 MG tablet Take 25 mg by mouth daily.    Historical Provider, MD  simvastatin (ZOCOR) 10 MG tablet Take 10 mg by mouth at bedtime.    Historical Provider, MD  VITAMIN E PO Take 1 capsule by mouth daily.    Historical Provider, MD    Physical Exam: Vitals:   05/15/16 0000 05/15/16 0100 05/15/16 0206 05/15/16 0216  BP: 141/68 111/60  (!) 160/83  Pulse: 76 68 79   Resp: 16 12 18    Temp:   98.4 F (36.9 C)   TempSrc:   Oral   SpO2: 98% 98% 100%   Weight:   61.8 kg (136 lb 3.2 oz)   Height:          Constitutional: NAD, calm,  comfortable Vitals:   05/15/16 0000 05/15/16 0100 05/15/16 0206 05/15/16 0216  BP: 141/68 111/60  (!) 160/83  Pulse: 76 68 79   Resp: 16 12 18    Temp:   98.4 F (36.9 C)   TempSrc:   Oral   SpO2: 98% 98% 100%   Weight:   61.8 kg (136 lb 3.2 oz)   Height:       Eyes: PERRL, lids and conjunctivae normal ENMT: Mucous membranes are moist. Posterior pharynx clear of any exudate or lesions. Normal dentition.  Neck: normal appearance, supple Respiratory: clear to auscultation bilaterally, no wheezing, no crackles. Normal respiratory effort. No accessory muscle use.  Cardiovascular: Normal rate, regular rhythm, no murmurs / rubs / gallops. No extremity edema. 2+ pedal pulses. GI: abdomen is soft and compressible.  No distention.  No tenderness.  No masses palpated.  Bowel sounds are hypoactive. Musculoskeletal:  No joint deformity in upper and lower extremities. Good ROM, no contractures. Normal muscle tone.  Skin: no rashes, warm and dry Neurologic: No focal deficits. Psychiatric: Normal judgment and insight. Alert and oriented x 3. Normal mood.     Labs on Admission: I have personally reviewed following labs and imaging studies  CBC:  Recent Labs Lab 05/14/16 2050  WBC 11.1*  NEUTROABS 8.5*  HGB 12.0  HCT 34.4*  MCV 89.1  PLT 99991111   Basic Metabolic Panel:  Recent Labs Lab 05/14/16 2050 05/14/16 2140  NA 116* 117*  K 4.3 4.5  CL 85* 86*  CO2 21* 22  GLUCOSE 113* 109*  BUN 8 8  CREATININE 0.48 0.55  CALCIUM 8.9 8.6*   GFR: Estimated Creatinine Clearance: 48.9 mL/min (by C-G formula based on SCr of 0.8 mg/dL). Liver Function Tests:  Recent Labs Lab 05/14/16 2050  AST 24  ALT 17  ALKPHOS 49  BILITOT 0.6  PROT 6.8  ALBUMIN 4.0    Recent Labs Lab 05/14/16 2050  LIPASE 27   Urine analysis:    Component Value Date/Time   COLORURINE YELLOW 05/14/2016 1915   APPEARANCEUR CLOUDY (A) 05/14/2016 1915   LABSPEC 1.007 05/14/2016 1915   PHURINE 7.0 05/14/2016  1915   GLUCOSEU NEGATIVE 05/14/2016 1915   HGBUR NEGATIVE 05/14/2016 1915   [place] NEGATIVE 05/14/2016 [place] 05/14/2016 1915   PROTEINUR NEGATIVE 05/14/2016 1915   NITRITE NEGATIVE 05/14/2016 1915   LEUKOCYTESUR SMALL (A) 05/14/2016 1915   Sepsis Labs:  Lactic acid level 1.07  EKG: Independently reviewed.  NSR.  No acute ST changes.  Assessment/Plan Principal Problem:   Hyponatremia Active Problems:   Hypertension   Hyperlipemia   UTI (lower urinary tract infection)   Leukocytosis      Probable hypovolemic hyponatremia (possibly acute on chronic) in the setting of diuretic use, new infection, and N/V. --Admit to telemetry --NS at 100 cc/hr --Send urine sodium, urine osmolality, and serum osmolality --BMP q6h for the first 24 hours --Hold lisinopril and HCTZ for now  UTI --She does not appear septic.   --IV Rocephin per pharmacy --Urine culture pending.  HTN --Continue home dose of metoprolol.  Add low dose amlodipine this morning.  Holding lisinopril and HCTZ for now --IV hydralazine prn  HLD --Continue home dose of statin   DVT prophylaxis: SCDs, early ambulation Code Status: FULL Family Communication: Patient alone at time of admission. Disposition Plan: Expect she will go home at discharge. Consults called: NONE Admission status: Accepted as OBSERVATION, Telemetry but I would expect she will meet inpatient status (hyponatremia usually does not correct in one day).   TIME SPENT: 60 minutes   Eber Jones MD [facility] Pager [phone]  If 7PM-7AM, please contact night-coverage [email or web address] Password [email or web address]  05/15/2016, 3:05 AM

## 2016-05-15 NOTE — Plan of Care (Signed)
Problem: Physical Regulation: Goal: Ability to maintain clinical measurements within normal limits will improve Outcome: Progressing Na+ 130 today  Problem: Skin Integrity: Goal: Risk for impaired skin integrity will decrease Outcome: Progressing Pt able to turn self in bed, can also ambulate independently   Problem: Activity: Goal: Risk for activity intolerance will decrease Outcome: Progressing Pt ambulated in room today and tolerated well  Problem: Bowel/Gastric: Goal: Will not experience complications related to bowel motility Outcome: Progressing Last BM 8/26; Colace and Ducolax ordered

## 2016-05-15 NOTE — Progress Notes (Signed)
PROGRESS NOTE                                                                                                                                                                                                             Patient Demographics:    Destiny Bruce, is a 80 y.o. female, DOB - 05/15/1936, UA:[id number]  Admit date - 05/14/2016   Admitting Physician Ivor Costa, MD  Outpatient Primary MD for the patient is Finis Bud, MD  LOS - 0  Chief Complaint  Patient presents with  . Dysuria       Brief Narrative    Destiny Bruce is a 80 y.o. woman with a history of HTN, HLD, GERD, and esophageal stricture who developed lower urinary tract symptoms on Thursday.  She was seen at an urgent care facility in [place] on Friday and was given a prescription for Macrobid.  She reports that she only took one dose on Friday night.  By Saturday morning, she was feeling worse with mental status changes, weakness, muscle aches, and nausea and vomiting.  She has had light-headedness but no syncope.  She denies abdominal pain or back pain.  No chest pain or shortness of breath.  She reported to the ED in [facility], where she was found to have a sodium level of 116.  She has reportedly had low sodium levels in the past, but she is not sure how low.  No recent changes to her home medications except for new antibiotic prescribed two days ago.  She says that she has been drinking a lot water to "flush her kidneys".  ED Course: Urine culture sent.  IV Rocephin given.  Patient started on NS at 75cc/hr.  She was transferred to [facility] for admission.     Subjective:    Destiny Bruce today has, No headache, No chest pain, No abdominal pain - No Nausea, No new weakness tingling or numbness, No Cough - SOB.     Assessment  & Plan :      1. Constipation and UTI causing nausea vomiting. Nausea vomiting resolved, placed  on bowel regimen, continue IV antibiotics for UTI and monitor cultures. Symptoms better.  2. Nausea and vomiting induced dehydration along with HCTZ use with hyponatremia. Improving with gentle hydration which will be continued, hold offending medications and monitor BMP closely.  3. HTN. Continue Norvasc and beta blocker, HCTZ and ARB on hold for now.  4. Dyslipidemia. On statin continue.    Family Communication  :  None  Code Status :  Full  Diet : Heart Healthy  Disposition Plan  :  Home in am  Consults  :  None  Procedures  :    DVT Prophylaxis  :   Heparin   Lab Results  Component Value Date   PLT 238 05/15/2016    Inpatient Medications  Scheduled Meds: . amLODipine  5 mg Oral Daily  . aspirin EC  81 mg Oral Daily  . bisacodyl  10 mg Rectal Daily  . cefTRIAXone (ROCEPHIN)  IV  1 g Intravenous Q24H  . docusate sodium  200 mg Oral BID  . metoprolol tartrate  25 mg Oral Daily  . pantoprazole  20 mg Oral Daily  . simvastatin  10 mg Oral QHS  . sodium chloride flush  3 mL Intravenous Q12H   Continuous Infusions: . sodium chloride     PRN Meds:.acetaminophen **OR** [DISCONTINUED] acetaminophen, hydrALAZINE, [DISCONTINUED] ondansetron **OR** ondansetron (ZOFRAN) IV  Antibiotics  :    Anti-infectives    Start     Dose/Rate Route Frequency Ordered Stop   05/15/16 2200  cefTRIAXone (ROCEPHIN) 1 g in dextrose 5 % 50 mL IVPB     1 g 100 mL/hr over 30 Minutes Intravenous Every 24 hours 05/15/16 0316     05/14/16 2130  cefTRIAXone (ROCEPHIN) 1 g in dextrose 5 % 50 mL IVPB     1 g 100 mL/hr over 30 Minutes Intravenous  Once 05/14/16 2125 05/14/16 2231         Objective:   Vitals:   05/15/16 0100 05/15/16 0206 05/15/16 0216 05/15/16 0442  BP: 111/60  (!) 160/83 138/63  Pulse: 68 79  65  Resp: 12 18  18   Temp:  98.4 F (36.9 C)  98 F (36.7 C)  TempSrc:  Oral  Oral  SpO2: 98% 100%  100%  Weight:  61.8 kg (136 lb 3.2 oz)    Height:        Wt Readings  from Last 3 Encounters:  05/15/16 61.8 kg (136 lb 3.2 oz)  04/09/15 60.8 kg (134 lb)  03/31/15 61 kg (134 lb 6 oz)     Intake/Output Summary (Last 24 hours) at 05/15/16 0954 Last data filed at 05/15/16 0900  Gross per 24 hour  Intake           436.67 ml  Output             1050 ml  Net          -613.33 ml     Physical Exam  Awake Alert, Oriented X 3, No new F.N deficits, Normal affect [place].AT,PERRAL Supple Neck,No JVD, No cervical lymphadenopathy appriciated.  Symmetrical Chest wall movement, Good air movement bilaterally, CTAB RRR,No Gallops,Rubs or new Murmurs, No Parasternal Heave +ve B.Sounds, Abd Soft, No tenderness, No organomegaly appriciated, No rebound - guarding or rigidity. No Cyanosis, Clubbing or edema, No new Rash or bruise       Data Review:    CBC  Recent Labs Lab 05/14/16 2050 05/15/16 0334  WBC 11.1* 8.7  HGB 12.0 12.2  HCT 34.4* 37.5  PLT 245 238  MCV 89.1 93.3  MCH 31.1 30.3  MCHC 34.9 32.5  RDW 12.8 13.2  LYMPHSABS 1.4 1.7  MONOABS 1.1* 1.2*  EOSABS 0.1 0.1  BASOSABS 0.0 0.0  Chemistries   Recent Labs Lab 05/14/16 2050 05/14/16 2140 05/15/16 0334 05/15/16 0754  NA 116* 117* 119* 130*  K 4.3 4.5 3.8 4.4  CL 85* 86* 90* 100*  CO2 21* 22 20* 22  GLUCOSE 113* 109* 100* 89  BUN 8 8 6  <5*  CREATININE 0.48 0.55 0.65 0.60  CALCIUM 8.9 8.6* 8.4* 8.9  AST 24  --   --   --   ALT 17  --   --   --   ALKPHOS 49  --   --   --   BILITOT 0.6  --   --   --    ------------------------------------------------------------------------------------------------------------------ No results for input(s): CHOL, HDL, LDLCALC, TRIG, CHOLHDL, LDLDIRECT in the last 72 hours.  No results found for: HGBA1C ------------------------------------------------------------------------------------------------------------------  Recent Labs  05/15/16 0334  TSH 1.553    ------------------------------------------------------------------------------------------------------------------ No results for input(s): VITAMINB12, FOLATE, FERRITIN, TIBC, IRON, RETICCTPCT in the last 72 hours.  Coagulation profile No results for input(s): INR, PROTIME in the last 168 hours.  No results for input(s): DDIMER in the last 72 hours.  Cardiac Enzymes No results for input(s): CKMB, TROPONINI, MYOGLOBIN in the last 168 hours.  Invalid input(s): CK ------------------------------------------------------------------------------------------------------------------ No results found for: BNP  Micro Results No results found for this or any previous visit (from the past 240 hour(s)).  Radiology Reports Dg Abd Portable 1v  Result Date: 05/15/2016 CLINICAL DATA:  Nausea, vomiting. EXAM: PORTABLE ABDOMEN - 1 VIEW COMPARISON:  03/19/2008 FINDINGS: Moderate stool burden throughout the colon. Nonobstructive bowel gas pattern. No free air, organomegaly or suspicious calcification. Prior right hip replacement. No acute bony abnormality. Visualized lung bases clear. IMPRESSION: Moderate stool burden.  No acute findings. Electronically Signed   By: Rolm Baptise M.D.   On: 05/15/2016 08:07    Time Spent in minutes  30   SINGH,PRASHANT K M.D on 05/15/2016 at 9:54 AM  Between 7am to 7pm - Pager - [phone]  After 7pm go to [email or web address] - password [facility]  [facility] -  Office  [phone]

## 2016-05-15 NOTE — Progress Notes (Signed)
Pharmacy Antibiotic Note  Destiny Bruce is a 80 y.o. female admitted on 05/14/2016 with UTI.  Pharmacy has been consulted for Rocephin dosing.  Plan: Rocephin 1 gm IV q24 Pharmacy to sign off  Height: 5\' 1"  (154.9 cm) Weight: 136 lb 3.2 oz (61.8 kg) IBW/kg (Calculated) : 47.8  Temp (24hrs), Avg:98.1 F (36.7 C), Min:97.9 F (36.6 C), Max:98.4 F (36.9 C)   Recent Labs Lab 05/14/16 2050 05/14/16 2054 05/14/16 2140  WBC 11.1*  --   --   CREATININE 0.48  --  0.55  LATICACIDVEN  --  1.07  --     Estimated Creatinine Clearance: 48.9 mL/min (by C-G formula based on SCr of 0.8 mg/dL).    Allergies  Allergen Reactions  . Codeine    Thank you for allowing pharmacy to be a part of this patient's care.  Eudelia Bunch, Pharm.D. BP:[phone] 05/15/2016 3:17 AM

## 2016-05-15 NOTE — Progress Notes (Signed)
Admission note:  Arrival Method: Patient arrived on stretcher by [facility] Mental Orientation: Alert and oriented x 4 Telemetry: Telemetry box 6e07 applied. CCMT notified.  Assessment: Completed, see flowsheets Skin: Dry and intact.  IV: Left AC. Site is clean, dry and intact Pain: Patient states no pain at this time Tubes: N/A Safety Measures: Bed in lowest position, non-slip socks placed, call light within reach, and bed alarm activated.  Fall Prevention Safety Plan: Reviewed with patient Admission Screening: Completed, see flowsheets 6700 Orientation: Patient has been oriented to the unit, staff and to the room. Orders have been reviewed and implemented. MD notified of patient's arrival to floor. Call light within reach, will continue to monitor.  Shelbie Hutching, RN, BSN

## 2016-05-16 LAB — BASIC METABOLIC PANEL
ANION GAP: 10 (ref 5–15)
BUN: 6 mg/dL (ref 6–20)
CO2: 23 mmol/L (ref 22–32)
Calcium: 9.2 mg/dL (ref 8.9–10.3)
Chloride: 99 mmol/L — ABNORMAL LOW (ref 101–111)
Creatinine, Ser: 0.68 mg/dL (ref 0.44–1.00)
Glucose, Bld: 84 mg/dL (ref 65–99)
POTASSIUM: 4.3 mmol/L (ref 3.5–5.1)
SODIUM: 132 mmol/L — AB (ref 135–145)

## 2016-05-16 LAB — URINE CULTURE

## 2016-05-16 MED ORDER — DOCUSATE SODIUM 100 MG PO CAPS: 200.0000 mg | ORAL_CAPSULE | Freq: Two times a day (BID) | ORAL | 0 refills | Status: DC | PRN

## 2016-05-16 MED ORDER — CEFPODOXIME PROXETIL 200 MG PO TABS: 200.0000 mg | ORAL_TABLET | Freq: Two times a day (BID) | ORAL | 0 refills | Status: AC

## 2016-05-16 NOTE — Progress Notes (Signed)
Destiny Bruce to be D/C'd Home per MD order.  Discussed prescriptions and follow up appointments with the patient. Prescriptions given to patient, medication list explained in detail. Pt verbalized understanding.    Medication List    TAKE these medications   cefpodoxime 200 MG tablet Commonly known as:  VANTIN Take 1 tablet (200 mg total) by mouth 2 (two) times daily.   COQ10 PO Take 1 capsule by mouth daily.   docusate sodium 100 MG capsule Commonly known as:  COLACE Take 2 capsules (200 mg total) by mouth 2 (two) times daily as needed for mild constipation.   ECOTRIN LOW STRENGTH 81 MG EC tablet Generic drug:  aspirin Take 81 mg by mouth daily. Swallow whole.   lansoprazole 15 MG capsule Commonly known as:  PREVACID Take 15 mg by mouth daily as needed.   LIQUID CALCIUM PO Take 15 mLs by mouth 2 (two) times daily.   lisinopril 20 MG tablet Commonly known as:  PRINIVIL,ZESTRIL Take 20 mg by mouth daily.   metoprolol 50 MG tablet Commonly known as:  LOPRESSOR Take 25 mg by mouth daily.   simvastatin 10 MG tablet Commonly known as:  ZOCOR Take 10 mg by mouth at bedtime.   VITAMIN B-12 PO Take 1 capsule by mouth daily.   Vitamin D3 2000 units Tabs Take 1 tablet by mouth daily.   VITAMIN E PO Take 1 capsule by mouth daily.       Vitals:   05/16/16 0558 05/16/16 0923  BP: 129/69 138/70  Pulse: 66 72  Resp: 18 18  Temp: 99 F (37.2 C) 99.1 F (37.3 C)    Skin clean, dry and intact without evidence of skin break down, no evidence of skin tears noted. IV catheter discontinued intact. Site without signs and symptoms of complications. Dressing and pressure applied. Pt denies pain at this time. No complaints noted.  An After Visit Summary was printed and given to the patient. Patient escorted via Walker, and D/C home via private auto.  Retta Mac BSN, RN

## 2016-05-16 NOTE — Discharge Instructions (Signed)
Follow with Primary MD Finis Bud, MD in 7 days   Get CBC, CMP, 2 view Chest X ray checked  by Primary MD or SNF MD in 5-7 days ( we routinely change or add medications that can affect your baseline labs and fluid status, therefore we recommend that you get the mentioned basic workup next visit with your PCP, your PCP may decide not to get them or add new tests based on their clinical decision)   Activity: As tolerated with Full fall precautions use walker/cane & assistance as needed   Disposition Home    Diet:   Heart Healthy.  For Heart failure patients - Check your Weight same time everyday, if you gain over 2 pounds, or you develop in leg swelling, experience more shortness of breath or chest pain, call your Primary MD immediately. Follow Cardiac Low Salt Diet and 1.5 lit/day fluid restriction.   On your next visit with your primary care physician please Get Medicines reviewed and adjusted.   Please request your Prim.MD to go over all Hospital Tests and Procedure/Radiological results at the follow up, please get all Hospital records sent to your Prim MD by signing hospital release before you go home.   If you experience worsening of your admission symptoms, develop shortness of breath, life threatening emergency, suicidal or homicidal thoughts you must seek medical attention immediately by calling 911 or calling your MD immediately  if symptoms less severe.  You Must read complete instructions/literature along with all the possible adverse reactions/side effects for all the Medicines you take and that have been prescribed to you. Take any new Medicines after you have completely understood and accpet all the possible adverse reactions/side effects.   Do not drive, operate heavy machinery, perform activities at heights, swimming or participation in water activities or provide baby sitting services if your were admitted for syncope or siezures until you have seen by Primary MD or a  Neurologist and advised to do so again.  Do not drive when taking Pain medications.    Do not take more than prescribed Pain, Sleep and Anxiety Medications  Special Instructions: If you have smoked or chewed Tobacco  in the last 2 yrs please stop smoking, stop any regular Alcohol  and or any Recreational drug use.  Wear Seat belts while driving.   Please note  You were cared for by a hospitalist during your hospital stay. If you have any questions about your discharge medications or the care you received while you were in the hospital after you are discharged, you can call the unit and asked to speak with the hospitalist on call if the hospitalist that took care of you is not available. Once you are discharged, your primary care physician will handle any further medical issues. Please note that NO REFILLS for any discharge medications will be authorized once you are discharged, as it is imperative that you return to your primary care physician (or establish a relationship with a primary care physician if you do not have one) for your aftercare needs so that they can reassess your need for medications and monitor your lab values.

## 2016-05-16 NOTE — Discharge Summary (Addendum)
Destiny Bruce [id number] DOB: [birth year] DOA: 05/14/2016  PCP: Finis Bud, MD  Admit date: 05/14/2016  Discharge date: 05/16/2016  Admitted From: Home   Disposition:  Home   Recommendations for Outpatient Follow-up:   Follow up with PCP in 1-2 weeks  PCP Please obtain BMP/CBC, 2 view CXR in 1week,  (see Discharge instructions)   PCP Please follow up on the following pending results: None   Home Health: None   Equipment/Devices: None  Consultations: None Discharge Condition: Stable   CODE STATUS: Full   Diet Recommendation: Heart Healthy   Chief Complaint  Patient presents with  . Dysuria     Brief history of present illness from the day of admission and additional interim summary    Destiny Dollarhiteis a 80 y.o.woman with a history of HTN, HLD, GERD, and esophageal stricture who developed lower urinary tract symptoms on Thursday. She was seen at an urgent care facility in [place] on Friday and was given a prescription for Macrobid. She reports that she only took one dose on Friday night. By Saturday morning, she was feeling worse with mental status changes, weakness, muscle aches, and nausea and vomiting. She has had light-headedness but no syncope. She denies abdominal pain or back pain. No chest pain or shortness of breath. She reported to the ED in [facility], where she was found to have a sodium level of 116. She has reportedly had low sodium levels in the past, but she is not sure how low. No recent changes to her home medications except for new antibiotic prescribed two days ago. She says that she has been drinking a lot water to "flush her kidneys".  ED Course:Urine culture sent. IV Rocephin given. Patient started on NS at 75cc/hr. She was transferred to [facility]  for admission.   Hospital issues addressed     1. Constipation and mild ? UTI causing nausea vomiting. Nausea vomiting resolved, placed on bowel regimen, borderline UA will give total of 3 days of antibiotics, symptoms completely resolved she is ambulating in the room eager to go home, placed on bowel regimen along with 3 days of total antibiotics were discharged home, follow-up with PCP within a week.  2. Nausea and vomiting induced dehydration along with hyponatremia. Resolved after IVF.  3. HTN. Continue Norvasc and beta blocker and Ace combination.  4. Dyslipidemia. On statin continue.   Discharge diagnosis     Principal Problem:   Hyponatremia Active Problems:   Hypertension   Hyperlipemia   UTI (lower urinary tract infection)   Leukocytosis    Discharge instructions    Discharge Instructions    Diet - low sodium heart healthy    Complete by:  As directed   Discharge instructions    Complete by:  As directed   Follow with Primary MD Finis Bud, MD in 7 days   Get CBC, CMP, 2 view Chest X ray checked  by Primary MD or SNF MD in 5-7 days ( we routinely change or add medications that can affect  your baseline labs and fluid status, therefore we recommend that you get the mentioned basic workup next visit with your PCP, your PCP may decide not to get them or add new tests based on their clinical decision)   Activity: As tolerated with Full fall precautions use walker/cane & assistance as needed   Disposition Home    Diet:   Heart Healthy.  For Heart failure patients - Check your Weight same time everyday, if you gain over 2 pounds, or you develop in leg swelling, experience more shortness of breath or chest pain, call your Primary MD immediately. Follow Cardiac Low Salt Diet and 1.5 lit/day fluid restriction.   On your next visit with your primary care physician please Get Medicines reviewed and adjusted.   Please request your Prim.MD to go over all  Hospital Tests and Procedure/Radiological results at the follow up, please get all Hospital records sent to your Prim MD by signing hospital release before you go home.   If you experience worsening of your admission symptoms, develop shortness of breath, life threatening emergency, suicidal or homicidal thoughts you must seek medical attention immediately by calling 911 or calling your MD immediately  if symptoms less severe.  You Must read complete instructions/literature along with all the possible adverse reactions/side effects for all the Medicines you take and that have been prescribed to you. Take any new Medicines after you have completely understood and accpet all the possible adverse reactions/side effects.   Do not drive, operate heavy machinery, perform activities at heights, swimming or participation in water activities or provide baby sitting services if your were admitted for syncope or siezures until you have seen by Primary MD or a Neurologist and advised to do so again.  Do not drive when taking Pain medications.    Do not take more than prescribed Pain, Sleep and Anxiety Medications  Special Instructions: If you have smoked or chewed Tobacco  in the last 2 yrs please stop smoking, stop any regular Alcohol  and or any Recreational drug use.  Wear Seat belts while driving.   Please note  You were cared for by a hospitalist during your hospital stay. If you have any questions about your discharge medications or the care you received while you were in the hospital after you are discharged, you can call the unit and asked to speak with the hospitalist on call if the hospitalist that took care of you is not available. Once you are discharged, your primary care physician will handle any further medical issues. Please note that NO REFILLS for any discharge medications will be authorized once you are discharged, as it is imperative that you return to your primary care physician (or  establish a relationship with a primary care physician if you do not have one) for your aftercare needs so that they can reassess your need for medications and monitor your lab values.   Increase activity slowly    Complete by:  As directed      Discharge Medications     Medication List    TAKE these medications   cefpodoxime 200 MG tablet Commonly known as:  VANTIN Take 1 tablet (200 mg total) by mouth 2 (two) times daily.   COQ10 PO Take 1 capsule by mouth daily.   docusate sodium 100 MG capsule Commonly known as:  COLACE Take 2 capsules (200 mg total) by mouth 2 (two) times daily as needed for mild constipation.   ECOTRIN LOW STRENGTH 81 MG EC tablet  Generic drug:  aspirin Take 81 mg by mouth daily. Swallow whole.   lansoprazole 15 MG capsule Commonly known as:  PREVACID Take 15 mg by mouth daily as needed.   LIQUID CALCIUM PO Take 15 mLs by mouth 2 (two) times daily.   lisinopril 20 MG tablet Commonly known as:  PRINIVIL,ZESTRIL Take 20 mg by mouth daily.   metoprolol 50 MG tablet Commonly known as:  LOPRESSOR Take 25 mg by mouth daily.   simvastatin 10 MG tablet Commonly known as:  ZOCOR Take 10 mg by mouth at bedtime.   VITAMIN B-12 PO Take 1 capsule by mouth daily.   Vitamin D3 2000 units Tabs Take 1 tablet by mouth daily.   VITAMIN E PO Take 1 capsule by mouth daily.       Follow-up Information    Finis Bud, MD. Schedule an appointment as soon as possible for a visit in 1 week(s).   Specialty:  Family Medicine Contact information: [id number] [place] [address] [phone]           Major procedures and Radiology Reports - PLEASE review detailed and final reports thoroughly  -        Dg Abd Portable 1v  Result Date: 05/15/2016 CLINICAL DATA:  Nausea, vomiting. EXAM: PORTABLE ABDOMEN - 1 VIEW COMPARISON:  03/19/2008 FINDINGS: Moderate stool burden throughout the colon. Nonobstructive bowel gas pattern. No  free air, organomegaly or suspicious calcification. Prior right hip replacement. No acute bony abnormality. Visualized lung bases clear. IMPRESSION: Moderate stool burden.  No acute findings. Electronically Signed   By: Rolm Baptise M.D.   On: 05/15/2016 08:07    Micro Results   No results found for this or any previous visit (from the past 240 hour(s)).  Today   Subjective    Destiny Bruce today has no headache,no chest abdominal pain,no new weakness tingling or numbness, feels much better wants to go home today.    Objective   Blood pressure 138/70, pulse 72, temperature 99.1 F (37.3 C), temperature source Oral, resp. rate 18, height 5\' 1"  (1.549 m), weight 61.8 kg (136 lb 3.2 oz), SpO2 98 %.   Intake/Output Summary (Last 24 hours) at 05/16/16 1038 Last data filed at 05/16/16 0836  Gross per 24 hour  Intake            990.5 ml  Output                0 ml  Net            990.5 ml    Exam Awake Alert, Oriented x 3, No new F.N deficits, Normal affect [place].AT,PERRAL Supple Neck,No JVD, No cervical lymphadenopathy appriciated.  Symmetrical Chest wall movement, Good air movement bilaterally, CTAB RRR,No Gallops,Rubs or new Murmurs, No Parasternal Heave +ve B.Sounds, Abd Soft, Non tender, No organomegaly appriciated, No rebound -guarding or rigidity. No Cyanosis, Clubbing or edema, No new Rash or bruise   Data Review   CBC w Diff:  Lab Results  Component Value Date   WBC 8.7 05/15/2016   HGB 12.2 05/15/2016   HCT 37.5 05/15/2016   PLT 238 05/15/2016   LYMPHOPCT 19 05/15/2016   MONOPCT 14 05/15/2016   EOSPCT 1 05/15/2016   BASOPCT 0 05/15/2016    CMP:  Lab Results  Component Value Date   NA 132 (L) 05/16/2016   K 4.3 05/16/2016   CL 99 (L) 05/16/2016   CO2 23 05/16/2016   BUN 6  05/16/2016   CREATININE 0.68 05/16/2016   PROT 6.8 05/14/2016   ALBUMIN 4.0 05/14/2016   BILITOT 0.6 05/14/2016   ALKPHOS 49 05/14/2016   AST 24 05/14/2016   ALT 17 05/14/2016    .   Total Time in preparing paper work, data evaluation and todays exam - 35 minutes  Thurnell Lose M.D on 05/16/2016 at 10:38 AM  [facility]   Office  [phone]

## 2016-05-20 DIAGNOSIS — N390 Urinary tract infection, site not specified: Secondary | ICD-10-CM | POA: Diagnosis not present

## 2016-05-20 DIAGNOSIS — I10 Essential (primary) hypertension: Secondary | ICD-10-CM | POA: Diagnosis not present

## 2016-05-20 DIAGNOSIS — Z09 Encounter for follow-up examination after completed treatment for conditions other than malignant neoplasm: Secondary | ICD-10-CM | POA: Diagnosis not present

## 2016-05-20 DIAGNOSIS — D72829 Elevated white blood cell count, unspecified: Secondary | ICD-10-CM | POA: Diagnosis not present

## 2016-05-20 DIAGNOSIS — E871 Hypo-osmolality and hyponatremia: Secondary | ICD-10-CM | POA: Diagnosis not present

## 2016-05-25 DIAGNOSIS — E871 Hypo-osmolality and hyponatremia: Secondary | ICD-10-CM | POA: Diagnosis not present

## 2016-05-25 DIAGNOSIS — R799 Abnormal finding of blood chemistry, unspecified: Secondary | ICD-10-CM | POA: Diagnosis not present

## 2016-05-30 DIAGNOSIS — M1711 Unilateral primary osteoarthritis, right knee: Secondary | ICD-10-CM | POA: Diagnosis not present

## 2016-05-30 DIAGNOSIS — M25561 Pain in right knee: Secondary | ICD-10-CM | POA: Diagnosis not present

## 2016-06-05 ENCOUNTER — Encounter: Payer: Self-pay | Admitting: Emergency Medicine

## 2016-06-05 ENCOUNTER — Emergency Department
Admission: EM | Admit: 2016-06-05 | Discharge: 2016-06-05 | Disposition: A | Discharge status: Home / Self Care | Payer: Commercial Managed Care - HMO | Source: Home / Self Care | Attending: Family Medicine | Admitting: Family Medicine

## 2016-06-05 DIAGNOSIS — S61411A Laceration without foreign body of right hand, initial encounter: Secondary | ICD-10-CM | POA: Diagnosis not present

## 2016-06-05 MED ORDER — MUPIROCIN 2 % EX OINT: 1.0000 "application " | TOPICAL_OINTMENT | Freq: Three times a day (TID) | CUTANEOUS | 0 refills | Status: DC

## 2016-06-05 NOTE — ED Provider Notes (Signed)
Destiny Bruce CARE    CSN: [id number] Arrival date & time: 06/05/16  1435  First Provider Contact:  First MD Initiated Contact with Patient 06/05/16 1615        History   Chief Complaint Chief Complaint  Patient presents with  . Hand Injury    HPI Destiny Bruce is a 80 y.o. female.   Several days ago patient experienced a superficial laceration to the dorsum of her right hand.  She has developed increasing redness/soreness around the wound.  She believes that her tetanus immunization is current.   The history is provided by the patient and a relative.  Laceration  Location:  Hand Hand laceration location:  Dorsum of R hand Length:  3cm Depth:  Through dermis Quality: straight   Time since incident:  3 days Laceration mechanism:  Metal edge Pain details:    Quality:  Aching   Severity:  Mild   Timing:  Constant   Progression:  Improving Foreign body present:  No foreign bodies Tetanus status:  Up to date Associated symptoms: redness   Associated symptoms: no fever, no numbness, no swelling and no streaking     Past Medical History:  Diagnosis Date  . Arthritis   . GERD (gastroesophageal reflux disease) 2008  . Hiatal hernia 2008  . Hx: UTI (urinary tract infection)   . Hyperlipemia   . Hypertension   . Kyphosis   . Osteoporosis   . Stricture and stenosis of esophagus 2008    Patient Active Problem List   Diagnosis Date Noted  . Leukocytosis 05/15/2016  . UTI (lower urinary tract infection) 05/14/2016  . Hyponatremia 05/14/2016  . Hypertension   . Hyperlipemia   . Odynophagia 03/31/2015  . Globus sensation 03/31/2015  . History of esophageal stricture 03/31/2015  . TINEA CRURIS 03/22/2010  . CELLULITIS, ARM 05/26/2009    Past Surgical History:  Procedure Laterality Date  . BREAST SURGERY     Cyst removed   . HIP SURGERY    . TUBAL LIGATION      OB History    No data available       Home Medications    Prior to Admission  medications   Medication Sig Start Date End Date Taking? Authorizing Provider  aspirin (ECOTRIN LOW STRENGTH) 81 MG EC tablet Take 81 mg by mouth daily. Swallow whole.    Historical Provider, MD  Calcium-Vitamin A-Vitamin D (LIQUID CALCIUM PO) Take 15 mLs by mouth 2 (two) times daily.     Historical Provider, MD  Cholecalciferol (VITAMIN D3) 2000 UNITS TABS Take 1 tablet by mouth daily.    Historical Provider, MD  Coenzyme Q10 (COQ10 PO) Take 1 capsule by mouth daily.    Historical Provider, MD  Cyanocobalamin (VITAMIN B-12 PO) Take 1 capsule by mouth daily.    Historical Provider, MD  docusate sodium (COLACE) 100 MG capsule Take 2 capsules (200 mg total) by mouth 2 (two) times daily as needed for mild constipation. 05/16/16   Thurnell Lose, MD  lansoprazole (PREVACID) 15 MG capsule Take 15 mg by mouth daily as needed.     Historical Provider, MD  lisinopril (PRINIVIL,ZESTRIL) 20 MG tablet Take 20 mg by mouth daily.    Historical Provider, MD  metoprolol (LOPRESSOR) 50 MG tablet Take 25 mg by mouth daily.    Historical Provider, MD  mupirocin ointment (BACTROBAN) 2 % Apply 1 application topically 3 (three) times daily. 06/05/16   Kandra Nicolas, MD  simvastatin (ZOCOR) 10 MG  tablet Take 10 mg by mouth at bedtime.    Historical Provider, MD  VITAMIN E PO Take 1 capsule by mouth daily.    Historical Provider, MD    Family History Family History  Problem Relation Age of Onset  . GER disease Mother   . Heart failure Father   . Cancer Maternal Grandfather   . CAD Maternal Uncle   . Cancer Paternal Aunt     "female cancer"  . Prostate cancer Maternal Uncle   . Leukemia Paternal Uncle   . Lung cancer Brother   . Heart disease Sister   . Colon cancer Neg Hx   . Esophageal cancer Neg Hx   . Stomach cancer Neg Hx     Social History Social History  Substance Use Topics  . Smoking status: Never Smoker  . Smokeless tobacco: Never Used  . Alcohol use No     Allergies   Macrobid  [nitrofurantoin monohyd macro] and Codeine   Review of Systems Review of Systems  Constitutional: Negative for fever.  All other systems reviewed and are negative.    Physical Exam Triage Vital Signs ED Triage Vitals  Enc Vitals Group     BP 06/05/16 1536 133/77     Pulse Rate 06/05/16 1536 85     Resp 06/05/16 1536 16     Temp 06/05/16 1536 97.8 F (36.6 C)     Temp Source 06/05/16 1536 Oral     SpO2 06/05/16 1536 99 %     Weight 06/05/16 1537 134 lb (60.8 kg)     Height 06/05/16 1537 5' (1.524 m)     Head Circumference --      Peak Flow --      Pain Score 06/05/16 1539 0     Pain Loc --      Pain Edu? --      Excl. in [place]? --    No data found.   Updated Vital Signs BP 133/77 (BP Location: Left Arm)   Pulse 85   Temp 97.8 F (36.6 C) (Oral)   Resp 16   Ht 5' (1.524 m)   Wt 134 lb (60.8 kg)   SpO2 99%   BMI 26.17 kg/m   Visual Acuity Right Eye Distance:   Left Eye Distance:   Bilateral Distance:    Right Eye Near:   Left Eye Near:    Bilateral Near:     Physical Exam  Constitutional: She appears well-developed and well-nourished. No distress.  HENT:  Head: Normocephalic.  Cardiovascular: Normal rate.   Musculoskeletal:       Right hand: She exhibits laceration.       Hands: Dorsum of right hand has a 3cm long superficial laceration/abrasion as noted on diagram.  There is surrounding erythema and mild tenderness to palpation, but no swelling or drainage.  Distal neurovascular function is intact.     Neurological: She is alert.  Skin: Skin is warm and dry.  Nursing note and vitals reviewed.    UC Treatments / Results  Labs (all labs ordered are listed, but only abnormal results are displayed) Labs Reviewed - No data to display  EKG  EKG Interpretation None       Radiology No results found.  Procedures Procedures (including critical care time)  Medications Ordered in UC Medications - No data to display   Initial Impression /  Assessment and Plan / UC Course  I have reviewed the triage vital signs and the nursing notes.  Pertinent labs & imaging results that were available during my care of the patient were reviewed by me and considered in my medical decision making (see chart for details).  Clinical Course  Early cellulitis present.  Reinforced wound with SteriStrips.  Applied Bacitracin and non-stick bandage.  Rx given for Mupirocin ointment. Change dressing daily and apply Mupirocin ointment to wound until healed.  Keep wound clean and dry.  Return for any signs of infection (or follow-up with family doctor):  Increasing redness, swelling, pain, heat, drainage, etc.  Check with your family doctor for status of tetanus shot.     Final Clinical Impressions(s) / UC Diagnoses   Final diagnoses:  Laceration of right hand, initial encounter    New Prescriptions Discharge Medication List as of 06/05/2016  4:38 PM    START taking these medications   Details  mupirocin ointment (BACTROBAN) 2 % Apply 1 application topically 3 (three) times daily., Starting Sun 06/05/2016, Print         Kandra Nicolas, MD 06/15/16 [phone]

## 2016-06-05 NOTE — Discharge Instructions (Signed)
Change dressing daily and apply Mupirocin ointment to wound until healed.  Keep wound clean and dry.  Return for any signs of infection (or follow-up with family doctor):  Increasing redness, swelling, pain, heat, drainage, etc.  Check with your family doctor for status of tetanus shot.

## 2016-06-07 DIAGNOSIS — I10 Essential (primary) hypertension: Secondary | ICD-10-CM | POA: Diagnosis not present

## 2016-06-07 DIAGNOSIS — E871 Hypo-osmolality and hyponatremia: Secondary | ICD-10-CM | POA: Diagnosis not present

## 2016-06-23 DIAGNOSIS — Z23 Encounter for immunization: Secondary | ICD-10-CM | POA: Diagnosis not present

## 2016-07-18 DIAGNOSIS — E871 Hypo-osmolality and hyponatremia: Secondary | ICD-10-CM | POA: Diagnosis not present

## 2016-07-18 DIAGNOSIS — I10 Essential (primary) hypertension: Secondary | ICD-10-CM | POA: Diagnosis not present

## 2016-07-20 DIAGNOSIS — M25561 Pain in right knee: Secondary | ICD-10-CM | POA: Diagnosis not present

## 2016-07-20 DIAGNOSIS — M1711 Unilateral primary osteoarthritis, right knee: Secondary | ICD-10-CM | POA: Diagnosis not present

## 2016-07-29 DIAGNOSIS — E871 Hypo-osmolality and hyponatremia: Secondary | ICD-10-CM | POA: Diagnosis not present

## 2016-08-03 DIAGNOSIS — N3001 Acute cystitis with hematuria: Secondary | ICD-10-CM | POA: Diagnosis not present

## 2016-08-10 DIAGNOSIS — E871 Hypo-osmolality and hyponatremia: Secondary | ICD-10-CM | POA: Diagnosis not present

## 2016-08-10 DIAGNOSIS — E875 Hyperkalemia: Secondary | ICD-10-CM | POA: Diagnosis not present

## 2016-08-10 DIAGNOSIS — I10 Essential (primary) hypertension: Secondary | ICD-10-CM | POA: Diagnosis not present

## 2016-08-12 ENCOUNTER — Emergency Department
Admission: EM | Admit: 2016-08-12 | Discharge: 2016-08-12 | Disposition: A | Discharge status: Home / Self Care | Payer: Commercial Managed Care - HMO | Source: Home / Self Care | Attending: Family Medicine | Admitting: Family Medicine

## 2016-08-12 ENCOUNTER — Encounter: Payer: Self-pay | Admitting: *Deleted

## 2016-08-12 DIAGNOSIS — I10 Essential (primary) hypertension: Secondary | ICD-10-CM

## 2016-08-12 NOTE — ED Triage Notes (Signed)
Patient c/o htn since yesterday, running [id number] systolic. Her PCP on 08/08/16 stopped her Lisinopril and changed to Amlodipine 5mg  due to elevated potassium levels. She reports she did well at first. Her Nephrologist told her 2 days ago to take 1/2 the amlodipine in the AM and 1/2 at night. She c/o some head pressure and feeling flushed. Called her PCP but did not get a return call.

## 2016-08-12 NOTE — Discharge Instructions (Signed)
Continue amlodipine 5mg , 1/2 tab twice daily, and metoprolol 50mg ,1/2 tab each morning.  Continue to monitor BP.

## 2016-08-12 NOTE — ED Provider Notes (Signed)
[name]    CSN: [id number] Arrival date & time: 08/12/16  1521     History   Chief Complaint Chief Complaint  Patient presents with  . Hypertension    HPI Destiny Bruce is a 80 y.o. female.   Patient has a history of hypertension which had been controlled with lisinopril and metoprolol 50mg , 1/2 daily.  On 08/08/16 her PCP stopped her lisinopril because of hyperkalemia and replaced it with amlodipine 5mg  daily.  She responded well initially.  Two days ago her nephrologist advised her to change her amlodipine 5mg  to 1/2 in the morning and 1/2 at night. She complains of some head pressure and feeling flushed.  Her BP was 163/86 this morning and 162/90 this evening.  She denies chest pain or localizing neurologic symptoms.  She states that she plans to go to [facility] in 3 days and tried to reach her PCP today.     The history is provided by the patient.    Past Medical History:  Diagnosis Date  . Arthritis   . GERD (gastroesophageal reflux disease) 2008  . Hiatal hernia 2008  . Hx: UTI (urinary tract infection)   . Hyperlipemia   . Hypertension   . Kyphosis   . Osteoporosis   . Stricture and stenosis of esophagus 2008    Patient Active Problem List   Diagnosis Date Noted  . Leukocytosis 05/15/2016  . UTI (lower urinary tract infection) 05/14/2016  . Hyponatremia 05/14/2016  . Hypertension   . Hyperlipemia   . Odynophagia 03/31/2015  . Globus sensation 03/31/2015  . History of esophageal stricture 03/31/2015  . TINEA CRURIS 03/22/2010  . CELLULITIS, ARM 05/26/2009    Past Surgical History:  Procedure Laterality Date  . BREAST SURGERY     Cyst removed   . HIP SURGERY    . TUBAL LIGATION      OB History    No data available       Home Medications    Prior to Admission medications   Medication Sig Start Date End Date Taking? Authorizing Provider  amLODipine (NORVASC) 5 MG tablet Take 5 mg by mouth daily.   Yes Historical  Provider, MD  aspirin (ECOTRIN LOW STRENGTH) 81 MG EC tablet Take 81 mg by mouth daily. Swallow whole.    Historical Provider, MD  Calcium-Vitamin A-Vitamin D (LIQUID CALCIUM PO) Take 15 mLs by mouth 2 (two) times daily.     Historical Provider, MD  Cholecalciferol (VITAMIN D3) 2000 UNITS TABS Take 1 tablet by mouth daily.    Historical Provider, MD  Coenzyme Q10 (COQ10 PO) Take 1 capsule by mouth daily.    Historical Provider, MD  Cyanocobalamin (VITAMIN B-12 PO) Take 1 capsule by mouth daily.    Historical Provider, MD  docusate sodium (COLACE) 100 MG capsule Take 2 capsules (200 mg total) by mouth 2 (two) times daily as needed for mild constipation. 05/16/16   Thurnell Lose, MD  lansoprazole (PREVACID) 15 MG capsule Take 15 mg by mouth daily as needed.     Historical Provider, MD  lisinopril (PRINIVIL,ZESTRIL) 20 MG tablet Take 20 mg by mouth daily.    Historical Provider, MD  metoprolol (LOPRESSOR) 50 MG tablet Take 25 mg by mouth daily.    Historical Provider, MD  simvastatin (ZOCOR) 10 MG tablet Take 10 mg by mouth at bedtime.    Historical Provider, MD  VITAMIN E PO Take 1 capsule by mouth daily.    Historical Provider, MD  Family History Family History  Problem Relation Age of Onset  . GER disease Mother   . Heart failure Father   . Cancer Maternal Grandfather   . CAD Maternal Uncle   . Cancer Paternal Aunt     "female cancer"  . Prostate cancer Maternal Uncle   . Leukemia Paternal Uncle   . Lung cancer Brother   . Heart disease Sister   . Colon cancer Neg Hx   . Esophageal cancer Neg Hx   . Stomach cancer Neg Hx     Social History Social History  Substance Use Topics  . Smoking status: Never Smoker  . Smokeless tobacco: Never Used  . Alcohol use No     Allergies   Macrobid [nitrofurantoin monohyd macro] and Codeine   Review of Systems Review of Systems  Constitutional: Negative for activity change, appetite change, chills, diaphoresis, fatigue, fever and  unexpected weight change.  HENT: Negative.   Eyes: Negative.   Respiratory: Negative for cough, chest tightness, shortness of breath and wheezing.   Cardiovascular: Negative for chest pain, palpitations and leg swelling.  Gastrointestinal: Negative.   Genitourinary: Negative.   Musculoskeletal: Negative.   Skin: Negative.   Neurological: Positive for headaches. Negative for dizziness, tremors, seizures, syncope, facial asymmetry, speech difficulty, weakness, light-headedness and numbness.     Physical Exam Triage Vital Signs ED Triage Vitals  Enc Vitals Group     BP 08/12/16 1558 183/83     Pulse Rate 08/12/16 1558 85     Resp 08/12/16 1558 14     Temp 08/12/16 1558 98.4 F (36.9 C)     Temp Source 08/12/16 1558 Oral     SpO2 08/12/16 1558 99 %     Weight 08/12/16 1558 132 lb (59.9 kg)     Height --      Head Circumference --      Peak Flow --      Pain Score 08/12/16 1600 0     Pain Loc --      Pain Edu? --      Excl. in [place]? --    No data found.   Updated Vital Signs BP 183/83 (BP Location: Left Arm)   Pulse 85   Temp 98.4 F (36.9 C) (Oral)   Resp 14   Wt 132 lb (59.9 kg)   SpO2 99%   BMI 25.78 kg/m   Visual Acuity Right Eye Distance:   Left Eye Distance:   Bilateral Distance:    Right Eye Near:   Left Eye Near:    Bilateral Near:     Physical Exam  Constitutional: She is oriented to person, place, and time. She appears well-developed and well-nourished. No distress.  HENT:  Head: Normocephalic.  Right Ear: External ear normal.  Left Ear: External ear normal.  Nose: Nose normal.  Mouth/Throat: Oropharynx is clear and moist.  Eyes: Conjunctivae and EOM are normal. Pupils are equal, round, and reactive to light.  Fundi benign  Neck: Neck supple. No JVD present.  Cardiovascular: Normal heart sounds.   Pulmonary/Chest: Breath sounds normal.  Abdominal: Soft. There is no tenderness.  Musculoskeletal: She exhibits no edema.  Neurological: She is  alert and oriented to person, place, and time.  Skin: Skin is warm and dry. No rash noted.  Nursing note and vitals reviewed.    UC Treatments / Results  Labs (all labs ordered are listed, but only abnormal results are displayed) Labs Reviewed - No data to display  EKG  EKG Interpretation None       Radiology No results found.  Procedures Procedures (including critical care time)  Medications Ordered in UC Medications - No data to display   Initial Impression / Assessment and Plan / UC Course  I have reviewed the triage vital signs and the nursing notes.  Pertinent labs & imaging results that were available during my care of the patient were reviewed by me and considered in my medical decision making (see chart for details).  Clinical Course   Continue amlodipine 5mg , 1/2 tab twice daily, and metoprolol 50mg ,1/2 tab each morning.  Continue to monitor BP. If BP remains elevated after 2 to 3 days, consider adding metoprolol 25mg  each evening. Followup with PCP after returning from upcoming trip.     Final Clinical Impressions(s) / UC Diagnoses   Final diagnoses:  Essential hypertension    New Prescriptions New Prescriptions   No medications on file     Kandra Nicolas, MD 08/24/16 [phone]

## 2016-08-19 DIAGNOSIS — R828 Abnormal findings on cytological and histological examination of urine: Secondary | ICD-10-CM | POA: Diagnosis not present

## 2016-08-19 DIAGNOSIS — K219 Gastro-esophageal reflux disease without esophagitis: Secondary | ICD-10-CM | POA: Diagnosis not present

## 2016-08-19 DIAGNOSIS — M810 Age-related osteoporosis without current pathological fracture: Secondary | ICD-10-CM | POA: Diagnosis not present

## 2016-08-19 DIAGNOSIS — I10 Essential (primary) hypertension: Secondary | ICD-10-CM | POA: Diagnosis not present

## 2016-08-19 DIAGNOSIS — Z1231 Encounter for screening mammogram for malignant neoplasm of breast: Secondary | ICD-10-CM | POA: Diagnosis not present

## 2016-08-19 DIAGNOSIS — E7800 Pure hypercholesterolemia, unspecified: Secondary | ICD-10-CM | POA: Diagnosis not present

## 2016-08-19 DIAGNOSIS — Z0000 Encounter for general adult medical examination without abnormal findings: Secondary | ICD-10-CM | POA: Diagnosis not present

## 2016-10-10 DIAGNOSIS — H5203 Hypermetropia, bilateral: Secondary | ICD-10-CM | POA: Diagnosis not present

## 2016-10-10 DIAGNOSIS — H2513 Age-related nuclear cataract, bilateral: Secondary | ICD-10-CM | POA: Diagnosis not present

## 2016-10-10 DIAGNOSIS — Z135 Encounter for screening for eye and ear disorders: Secondary | ICD-10-CM | POA: Diagnosis not present

## 2017-01-04 DIAGNOSIS — H25813 Combined forms of age-related cataract, bilateral: Secondary | ICD-10-CM | POA: Diagnosis not present

## 2017-01-04 DIAGNOSIS — H527 Unspecified disorder of refraction: Secondary | ICD-10-CM | POA: Diagnosis not present

## 2017-01-04 DIAGNOSIS — H52203 Unspecified astigmatism, bilateral: Secondary | ICD-10-CM | POA: Diagnosis not present

## 2017-01-04 DIAGNOSIS — H43812 Vitreous degeneration, left eye: Secondary | ICD-10-CM | POA: Diagnosis not present

## 2017-01-04 DIAGNOSIS — H40033 Anatomical narrow angle, bilateral: Secondary | ICD-10-CM | POA: Diagnosis not present

## 2017-01-23 ENCOUNTER — Emergency Department
Admission: EM | Admit: 2017-01-23 | Discharge: 2017-01-23 | Disposition: A | Discharge status: Home / Self Care | Payer: Medicare HMO | Source: Home / Self Care | Attending: Family Medicine | Admitting: Family Medicine

## 2017-01-23 ENCOUNTER — Encounter: Payer: Self-pay | Admitting: Emergency Medicine

## 2017-01-23 DIAGNOSIS — R609 Edema, unspecified: Secondary | ICD-10-CM | POA: Diagnosis not present

## 2017-01-23 MED ORDER — TRIAMTERENE-HCTZ 37.5-25 MG PO TABS: ORAL_TABLET | ORAL | 0 refills | Status: DC

## 2017-01-23 NOTE — ED Triage Notes (Signed)
Pt c/o bi lateral leg swelling and burning x2 weeks.

## 2017-01-23 NOTE — ED Provider Notes (Signed)
[name]    CSN: [id number] Arrival date & time: 01/23/17  1429     History   Chief Complaint Chief Complaint  Patient presents with  . Leg Swelling    HPI Destiny Bruce is a 81 y.o. female.   Patient reports that she has had increased swelling in both lower legs at the end of each day, and the swelling is resolved by morning.  However, during the past several days she has noticed mild residual swelling in her ankles each morning with a burning sensation.  She denies shortness of breath and PND.  No chest pain.  She notes that her weight has increased slightly.   The history is provided by the patient.    Past Medical History:  Diagnosis Date  . Arthritis   . GERD (gastroesophageal reflux disease) 2008  . Hiatal hernia 2008  . Hx: UTI (urinary tract infection)   . Hyperlipemia   . Hypertension   . Kyphosis   . Osteoporosis   . Stricture and stenosis of esophagus 2008    Patient Active Problem List   Diagnosis Date Noted  . Leukocytosis 05/15/2016  . UTI (lower urinary tract infection) 05/14/2016  . Hyponatremia 05/14/2016  . Hypertension   . Hyperlipemia   . Odynophagia 03/31/2015  . Globus sensation 03/31/2015  . History of esophageal stricture 03/31/2015  . TINEA CRURIS 03/22/2010  . CELLULITIS, ARM 05/26/2009    Past Surgical History:  Procedure Laterality Date  . BREAST SURGERY     Cyst removed   . HIP SURGERY    . TUBAL LIGATION      OB History    No data available       Home Medications    Prior to Admission medications   Medication Sig Start Date End Date Taking? Authorizing Provider  amLODipine (NORVASC) 5 MG tablet Take 5 mg by mouth daily.    [provider]  aspirin (ECOTRIN LOW STRENGTH) 81 MG EC tablet Take 81 mg by mouth daily. Swallow whole.    [provider]  Calcium-Vitamin A-Vitamin D (LIQUID CALCIUM PO) Take 15 mLs by mouth 2 (two) times daily.     [provider]    Cholecalciferol (VITAMIN D3) 2000 UNITS TABS Take 1 tablet by mouth daily.    [provider]  Coenzyme Q10 (COQ10 PO) Take 1 capsule by mouth daily.    [provider]  Cyanocobalamin (VITAMIN B-12 PO) Take 1 capsule by mouth daily.    [provider]  docusate sodium (COLACE) 100 MG capsule Take 2 capsules (200 mg total) by mouth 2 (two) times daily as needed for mild constipation. 05/16/16   Thurnell Lose, MD  lansoprazole (PREVACID) 15 MG capsule Take 15 mg by mouth daily as needed.     [provider]  lisinopril (PRINIVIL,ZESTRIL) 20 MG tablet Take 20 mg by mouth daily.    [provider]  metoprolol (LOPRESSOR) 50 MG tablet Take 25 mg by mouth daily.    [provider]  simvastatin (ZOCOR) 10 MG tablet Take 10 mg by mouth at bedtime.    [provider]  triamterene-hydrochlorothiazide (MAXZIDE-25) 37.5-25 MG tablet Take one tab PO each morning for 3 to 5 days until swelling resolves. 01/23/17 02/13/17  Kandra Nicolas, MD  VITAMIN E PO Take 1 capsule by mouth daily.    [provider]    Family History Family History  Problem Relation Age of Onset  . GER disease Mother   .  Heart failure Father   . Cancer Maternal Grandfather   . CAD Maternal Uncle   . Cancer Paternal Aunt     "female cancer"  . Prostate cancer Maternal Uncle   . Leukemia Paternal Uncle   . Lung cancer Brother   . Heart disease Sister   . Colon cancer Neg Hx   . Esophageal cancer Neg Hx   . Stomach cancer Neg Hx     Social History Social History  Substance Use Topics  . Smoking status: Never Smoker  . Smokeless tobacco: Never Used  . Alcohol use No     Allergies   Macrobid [nitrofurantoin monohyd macro] and Codeine   Review of Systems Review of Systems  Constitutional: Negative.   HENT: Negative.   Eyes: Negative.   Respiratory: Negative for cough, chest tightness, shortness of breath, wheezing and stridor.    Cardiovascular: Positive for leg swelling. Negative for chest pain and palpitations.  Gastrointestinal: Negative.   Genitourinary: Negative.   Musculoskeletal: Negative.   Skin: Negative.   Neurological: Negative.      Physical Exam Triage Vital Signs ED Triage Vitals  Enc Vitals Group     BP 01/23/17 1454 (!) 150/81     Pulse --      Resp --      Temp 01/23/17 1454 98.5 F (36.9 C)     Temp Source 01/23/17 1454 Oral     SpO2 01/23/17 1454 98 %     Weight 01/23/17 1455 137 lb (62.1 kg)     Height --      Head Circumference --      Peak Flow --      Pain Score 01/23/17 1455 0     Pain Loc --      Pain Edu? --      Excl. in [place]? --    No data found.   Updated Vital Signs BP (!) 150/81 (BP Location: Right Arm)   Temp 98.5 F (36.9 C) (Oral)   Wt 137 lb (62.1 kg)   SpO2 98%   BMI 26.76 kg/m   Visual Acuity Right Eye Distance:   Left Eye Distance:   Bilateral Distance:    Right Eye Near:   Left Eye Near:    Bilateral Near:     Physical Exam Nursing notes and Vital Signs reviewed. Appearance:  Patient appears stated age, and in no acute distress.  She is alert and oriented.  Eyes:  Pupils are equal, round, and reactive to light and accomodation.  Extraocular movement is intact.  Conjunctivae are not inflamed   Pharynx:  Normal; moist mucous membranes  Neck:  Supple.  No adenopathy.  No JVD Lungs:  Clear to auscultation.  Breath sounds are equal.  Moving air well. Heart:  Regular rate and rhythm without murmurs, rubs, or gallops.  Abdomen:  Nontender without masses or hepatosplenomegaly.  Bowel sounds are present.  No CVA or flank tenderness.  Extremities:   Trace pitting edema lower legs and ankles. Skin:  No rash present.     UC Treatments / Results  Labs (all labs ordered are listed, but only abnormal results are displayed) Labs Reviewed - No data to display  EKG  EKG Interpretation None       Radiology No results  found.  Procedures Procedures (including critical care time)  Medications Ordered in UC Medications - No data to display   Initial Impression / Assessment and Plan / UC Course  I have reviewed the triage  vital signs and the nursing notes.  Pertinent labs & imaging results that were available during my care of the patient were reviewed by me and considered in my medical decision making (see chart for details).    Note past mild hyponatremia.  Rx for Maxzide 37.6-25, one each AM for 3 to 5 days until swelling resolves. Follow-up with PCP if not improved one week. Minimize salt intake. Recommend wearing medical support hose daytime.    Final Clinical Impressions(s) / UC Diagnoses   Final diagnoses:  Dependent edema    New Prescriptions New Prescriptions   TRIAMTERENE-HYDROCHLOROTHIAZIDE (MAXZIDE-25) 37.5-25 MG TABLET    Take one tab PO each morning for 3 to 5 days until swelling resolves.     Kandra Nicolas, MD 01/23/17 [phone]

## 2017-01-23 NOTE — Discharge Instructions (Signed)
Minimize salt intake. Recommend wearing medical support hose daytime.

## 2017-01-27 DIAGNOSIS — R3915 Urgency of urination: Secondary | ICD-10-CM | POA: Diagnosis not present

## 2017-01-27 DIAGNOSIS — R42 Dizziness and giddiness: Secondary | ICD-10-CM | POA: Diagnosis not present

## 2017-01-27 DIAGNOSIS — M7989 Other specified soft tissue disorders: Secondary | ICD-10-CM | POA: Diagnosis not present

## 2017-01-27 DIAGNOSIS — R5383 Other fatigue: Secondary | ICD-10-CM | POA: Diagnosis not present

## 2017-01-27 DIAGNOSIS — I8001 Phlebitis and thrombophlebitis of superficial vessels of right lower extremity: Secondary | ICD-10-CM | POA: Diagnosis not present

## 2017-01-27 DIAGNOSIS — R600 Localized edema: Secondary | ICD-10-CM | POA: Diagnosis not present

## 2017-01-27 DIAGNOSIS — I10 Essential (primary) hypertension: Secondary | ICD-10-CM | POA: Diagnosis not present

## 2017-02-06 DIAGNOSIS — D72829 Elevated white blood cell count, unspecified: Secondary | ICD-10-CM | POA: Diagnosis not present

## 2017-02-20 DIAGNOSIS — H25813 Combined forms of age-related cataract, bilateral: Secondary | ICD-10-CM | POA: Diagnosis not present

## 2017-02-20 DIAGNOSIS — I10 Essential (primary) hypertension: Secondary | ICD-10-CM | POA: Diagnosis not present

## 2017-02-28 DIAGNOSIS — E875 Hyperkalemia: Secondary | ICD-10-CM | POA: Diagnosis not present

## 2017-03-02 DIAGNOSIS — H25812 Combined forms of age-related cataract, left eye: Secondary | ICD-10-CM | POA: Diagnosis not present

## 2017-03-02 DIAGNOSIS — I10 Essential (primary) hypertension: Secondary | ICD-10-CM | POA: Diagnosis not present

## 2017-03-02 DIAGNOSIS — H25813 Combined forms of age-related cataract, bilateral: Secondary | ICD-10-CM | POA: Diagnosis not present

## 2017-03-02 DIAGNOSIS — Z883 Allergy status to other anti-infective agents status: Secondary | ICD-10-CM | POA: Diagnosis not present

## 2017-03-02 DIAGNOSIS — H52203 Unspecified astigmatism, bilateral: Secondary | ICD-10-CM | POA: Diagnosis not present

## 2017-03-02 DIAGNOSIS — H40033 Anatomical narrow angle, bilateral: Secondary | ICD-10-CM | POA: Diagnosis not present

## 2017-03-02 DIAGNOSIS — K219 Gastro-esophageal reflux disease without esophagitis: Secondary | ICD-10-CM | POA: Diagnosis not present

## 2017-03-02 DIAGNOSIS — Z885 Allergy status to narcotic agent status: Secondary | ICD-10-CM | POA: Diagnosis not present

## 2017-03-02 DIAGNOSIS — H278 Other specified disorders of lens: Secondary | ICD-10-CM | POA: Diagnosis not present

## 2017-03-02 DIAGNOSIS — E785 Hyperlipidemia, unspecified: Secondary | ICD-10-CM | POA: Diagnosis not present

## 2017-03-03 DIAGNOSIS — Z961 Presence of intraocular lens: Secondary | ICD-10-CM | POA: Diagnosis not present

## 2017-03-03 DIAGNOSIS — H25811 Combined forms of age-related cataract, right eye: Secondary | ICD-10-CM | POA: Diagnosis not present

## 2017-03-03 DIAGNOSIS — H40033 Anatomical narrow angle, bilateral: Secondary | ICD-10-CM | POA: Diagnosis not present

## 2017-03-03 DIAGNOSIS — H52201 Unspecified astigmatism, right eye: Secondary | ICD-10-CM | POA: Diagnosis not present

## 2017-03-08 DIAGNOSIS — E875 Hyperkalemia: Secondary | ICD-10-CM | POA: Diagnosis not present

## 2017-03-08 DIAGNOSIS — I10 Essential (primary) hypertension: Secondary | ICD-10-CM | POA: Diagnosis not present

## 2017-03-08 DIAGNOSIS — E871 Hypo-osmolality and hyponatremia: Secondary | ICD-10-CM | POA: Diagnosis not present

## 2017-03-09 DIAGNOSIS — I10 Essential (primary) hypertension: Secondary | ICD-10-CM | POA: Diagnosis not present

## 2017-03-09 DIAGNOSIS — K219 Gastro-esophageal reflux disease without esophagitis: Secondary | ICD-10-CM | POA: Diagnosis not present

## 2017-03-09 DIAGNOSIS — H25811 Combined forms of age-related cataract, right eye: Secondary | ICD-10-CM | POA: Diagnosis not present

## 2017-03-09 DIAGNOSIS — Z87891 Personal history of nicotine dependence: Secondary | ICD-10-CM | POA: Diagnosis not present

## 2017-03-09 DIAGNOSIS — Z79899 Other long term (current) drug therapy: Secondary | ICD-10-CM | POA: Diagnosis not present

## 2017-03-09 DIAGNOSIS — E785 Hyperlipidemia, unspecified: Secondary | ICD-10-CM | POA: Diagnosis not present

## 2017-03-10 DIAGNOSIS — H43812 Vitreous degeneration, left eye: Secondary | ICD-10-CM | POA: Diagnosis not present

## 2017-03-10 DIAGNOSIS — E875 Hyperkalemia: Secondary | ICD-10-CM | POA: Diagnosis not present

## 2017-03-10 DIAGNOSIS — M810 Age-related osteoporosis without current pathological fracture: Secondary | ICD-10-CM | POA: Diagnosis not present

## 2017-03-10 DIAGNOSIS — E7800 Pure hypercholesterolemia, unspecified: Secondary | ICD-10-CM | POA: Diagnosis not present

## 2017-03-10 DIAGNOSIS — E119 Type 2 diabetes mellitus without complications: Secondary | ICD-10-CM | POA: Diagnosis not present

## 2017-03-10 DIAGNOSIS — Z961 Presence of intraocular lens: Secondary | ICD-10-CM | POA: Diagnosis not present

## 2017-03-10 DIAGNOSIS — I10 Essential (primary) hypertension: Secondary | ICD-10-CM | POA: Diagnosis not present

## 2017-03-10 DIAGNOSIS — E222 Syndrome of inappropriate secretion of antidiuretic hormone: Secondary | ICD-10-CM | POA: Diagnosis not present

## 2017-03-10 DIAGNOSIS — H40033 Anatomical narrow angle, bilateral: Secondary | ICD-10-CM | POA: Diagnosis not present

## 2017-03-16 DIAGNOSIS — H2513 Age-related nuclear cataract, bilateral: Secondary | ICD-10-CM | POA: Diagnosis not present

## 2017-04-04 DIAGNOSIS — Z961 Presence of intraocular lens: Secondary | ICD-10-CM | POA: Diagnosis not present

## 2017-04-24 DIAGNOSIS — E222 Syndrome of inappropriate secretion of antidiuretic hormone: Secondary | ICD-10-CM | POA: Diagnosis not present

## 2017-04-24 DIAGNOSIS — R600 Localized edema: Secondary | ICD-10-CM | POA: Diagnosis not present

## 2017-04-24 DIAGNOSIS — I10 Essential (primary) hypertension: Secondary | ICD-10-CM | POA: Diagnosis not present

## 2017-04-24 DIAGNOSIS — R8299 Other abnormal findings in urine: Secondary | ICD-10-CM | POA: Diagnosis not present

## 2017-04-26 DIAGNOSIS — Z5181 Encounter for therapeutic drug level monitoring: Secondary | ICD-10-CM | POA: Diagnosis not present

## 2017-05-03 DIAGNOSIS — Z1231 Encounter for screening mammogram for malignant neoplasm of breast: Secondary | ICD-10-CM | POA: Diagnosis not present

## 2017-05-04 DIAGNOSIS — M40204 Unspecified kyphosis, thoracic region: Secondary | ICD-10-CM | POA: Diagnosis not present

## 2017-05-04 DIAGNOSIS — Z87440 Personal history of urinary (tract) infections: Secondary | ICD-10-CM | POA: Diagnosis not present

## 2017-05-04 DIAGNOSIS — K820 Obstruction of gallbladder: Secondary | ICD-10-CM | POA: Diagnosis not present

## 2017-05-04 DIAGNOSIS — M419 Scoliosis, unspecified: Secondary | ICD-10-CM | POA: Diagnosis not present

## 2017-05-04 DIAGNOSIS — K5730 Diverticulosis of large intestine without perforation or abscess without bleeding: Secondary | ICD-10-CM | POA: Diagnosis not present

## 2017-05-04 DIAGNOSIS — Z96641 Presence of right artificial hip joint: Secondary | ICD-10-CM | POA: Diagnosis not present

## 2017-05-04 DIAGNOSIS — R42 Dizziness and giddiness: Secondary | ICD-10-CM | POA: Diagnosis not present

## 2017-05-04 DIAGNOSIS — E785 Hyperlipidemia, unspecified: Secondary | ICD-10-CM | POA: Diagnosis not present

## 2017-05-04 DIAGNOSIS — E222 Syndrome of inappropriate secretion of antidiuretic hormone: Secondary | ICD-10-CM | POA: Diagnosis not present

## 2017-05-04 DIAGNOSIS — R51 Headache: Secondary | ICD-10-CM | POA: Diagnosis not present

## 2017-05-04 DIAGNOSIS — I10 Essential (primary) hypertension: Secondary | ICD-10-CM | POA: Diagnosis not present

## 2017-05-04 DIAGNOSIS — R102 Pelvic and perineal pain: Secondary | ICD-10-CM | POA: Diagnosis not present

## 2017-05-04 DIAGNOSIS — R160 Hepatomegaly, not elsewhere classified: Secondary | ICD-10-CM | POA: Diagnosis not present

## 2017-05-04 DIAGNOSIS — K449 Diaphragmatic hernia without obstruction or gangrene: Secondary | ICD-10-CM | POA: Diagnosis not present

## 2017-05-04 DIAGNOSIS — R310 Gross hematuria: Secondary | ICD-10-CM | POA: Diagnosis not present

## 2017-05-04 DIAGNOSIS — K219 Gastro-esophageal reflux disease without esophagitis: Secondary | ICD-10-CM | POA: Diagnosis not present

## 2017-05-04 DIAGNOSIS — R319 Hematuria, unspecified: Secondary | ICD-10-CM | POA: Diagnosis not present

## 2017-05-04 DIAGNOSIS — E871 Hypo-osmolality and hyponatremia: Secondary | ICD-10-CM | POA: Diagnosis not present

## 2017-05-04 DIAGNOSIS — D1771 Benign lipomatous neoplasm of kidney: Secondary | ICD-10-CM | POA: Diagnosis not present

## 2017-05-04 DIAGNOSIS — D72829 Elevated white blood cell count, unspecified: Secondary | ICD-10-CM | POA: Diagnosis not present

## 2017-05-04 DIAGNOSIS — R600 Localized edema: Secondary | ICD-10-CM | POA: Diagnosis not present

## 2017-05-05 DIAGNOSIS — M40204 Unspecified kyphosis, thoracic region: Secondary | ICD-10-CM | POA: Diagnosis not present

## 2017-05-05 DIAGNOSIS — K449 Diaphragmatic hernia without obstruction or gangrene: Secondary | ICD-10-CM | POA: Diagnosis not present

## 2017-05-05 DIAGNOSIS — R310 Gross hematuria: Secondary | ICD-10-CM | POA: Diagnosis not present

## 2017-05-06 DIAGNOSIS — R310 Gross hematuria: Secondary | ICD-10-CM | POA: Diagnosis not present

## 2017-05-06 DIAGNOSIS — E222 Syndrome of inappropriate secretion of antidiuretic hormone: Secondary | ICD-10-CM | POA: Diagnosis not present

## 2017-05-06 DIAGNOSIS — I10 Essential (primary) hypertension: Secondary | ICD-10-CM | POA: Diagnosis not present

## 2017-05-06 DIAGNOSIS — K449 Diaphragmatic hernia without obstruction or gangrene: Secondary | ICD-10-CM | POA: Diagnosis not present

## 2017-05-06 DIAGNOSIS — D1771 Benign lipomatous neoplasm of kidney: Secondary | ICD-10-CM | POA: Diagnosis not present

## 2017-05-06 DIAGNOSIS — E871 Hypo-osmolality and hyponatremia: Secondary | ICD-10-CM | POA: Diagnosis not present

## 2017-05-06 DIAGNOSIS — K820 Obstruction of gallbladder: Secondary | ICD-10-CM | POA: Diagnosis not present

## 2017-05-12 ENCOUNTER — Emergency Department
Admission: EM | Admit: 2017-05-12 | Discharge: 2017-05-12 | Disposition: A | Discharge status: Home / Self Care | Payer: Medicare HMO | Source: Home / Self Care | Attending: Family Medicine | Admitting: Family Medicine

## 2017-05-12 DIAGNOSIS — R300 Dysuria: Secondary | ICD-10-CM | POA: Diagnosis not present

## 2017-05-12 LAB — POCT URINALYSIS DIP (MANUAL ENTRY)
BILIRUBIN UA: NEGATIVE
BILIRUBIN UA: NEGATIVE mg/dL
Glucose, UA: NEGATIVE mg/dL
Nitrite, UA: NEGATIVE
Protein Ur, POC: NEGATIVE mg/dL
SPEC GRAV UA: 1.015 (ref 1.010–1.025)
Urobilinogen, UA: 0.2 E.U./dL
pH, UA: 7.5 (ref 5.0–8.0)

## 2017-05-12 MED ORDER — CEFDINIR 300 MG PO CAPS: 300.0000 mg | ORAL_CAPSULE | Freq: Two times a day (BID) | ORAL | 0 refills | Status: DC

## 2017-05-12 NOTE — ED Triage Notes (Signed)
Pt had blood in urine last Thursday.  Went to the ER, was admitted.  Had 2 CT scans done, Thursday and Saturday for possible kidney stones.  Both negative.  Started with burning this am.

## 2017-05-12 NOTE — Discharge Instructions (Signed)
Continue adequate fluid intake. °If symptoms become significantly worse during the night or over the weekend, proceed to the local emergency room.  °

## 2017-05-12 NOTE — ED Provider Notes (Signed)
[facility]    CSN: [id number] Arrival date & time: 05/12/17  1526     History   Chief Complaint Chief Complaint  Patient presents with  . Urinary Frequency    burning    HPI Destiny Bruce is a 81 y.o. female.   Patient complains of onset of frequency and dysuria this morning at 1:30am.  Patient developed had hematuria one week ago, and went to the ER where she was found to have hyponatremia and was admitted.  CT scans were negative.  She has a follow-up appointment with urologist Dr. Elnoria Howard on Sept 7.   The history is provided by the patient and a relative.  Urinary Frequency  This is a new problem. The current episode started 12 to 24 hours ago. The problem occurs constantly. The problem has not changed since onset.Pertinent negatives include no abdominal pain and no headaches. Nothing aggravates the symptoms. Nothing relieves the symptoms. She has tried nothing for the symptoms.    Past Medical History:  Diagnosis Date  . Arthritis   . GERD (gastroesophageal reflux disease) 2008  . Hiatal hernia 2008  . Hx: UTI (urinary tract infection)   . Hyperlipemia   . Hypertension   . Kyphosis   . Osteoporosis   . Stricture and stenosis of esophagus 2008    Patient Active Problem List   Diagnosis Date Noted  . Leukocytosis 05/15/2016  . UTI (lower urinary tract infection) 05/14/2016  . Hyponatremia 05/14/2016  . Hypertension   . Hyperlipemia   . Odynophagia 03/31/2015  . Globus sensation 03/31/2015  . History of esophageal stricture 03/31/2015  . TINEA CRURIS 03/22/2010  . CELLULITIS, ARM 05/26/2009    Past Surgical History:  Procedure Laterality Date  . BREAST SURGERY     Cyst removed   . HIP SURGERY    . TUBAL LIGATION      OB History    No data available       Home Medications    Prior to Admission medications   Medication Sig Start Date End Date Taking? Authorizing Provider  amLODipine (NORVASC) 5 MG tablet Take 5 mg by mouth daily.     [provider]  aspirin (ECOTRIN LOW STRENGTH) 81 MG EC tablet Take 81 mg by mouth daily. Swallow whole.    [provider]  Calcium-Vitamin A-Vitamin D (LIQUID CALCIUM PO) Take 15 mLs by mouth 2 (two) times daily.     [provider]  cefdinir (OMNICEF) 300 MG capsule Take 1 capsule (300 mg total) by mouth 2 (two) times daily. 05/12/17   Kandra Nicolas, MD  Cholecalciferol (VITAMIN D3) 2000 UNITS TABS Take 1 tablet by mouth daily.    [provider]  Coenzyme Q10 (COQ10 PO) Take 1 capsule by mouth daily.    [provider]  Cyanocobalamin (VITAMIN B-12 PO) Take 1 capsule by mouth daily.    [provider]  docusate sodium (COLACE) 100 MG capsule Take 2 capsules (200 mg total) by mouth 2 (two) times daily as needed for mild constipation. 05/16/16   Thurnell Lose, MD  lansoprazole (PREVACID) 15 MG capsule Take 15 mg by mouth daily as needed.     [provider]  lisinopril (PRINIVIL,ZESTRIL) 20 MG tablet Take 20 mg by mouth daily.    [provider]  metoprolol (LOPRESSOR) 50 MG tablet Take 25 mg by mouth daily.    [provider]  simvastatin (ZOCOR) 10 MG tablet Take 10 mg by mouth at  bedtime.    [provider]  triamterene-hydrochlorothiazide (MAXZIDE-25) 37.5-25 MG tablet Take one tab PO each morning for 3 to 5 days until swelling resolves. 01/23/17 02/13/17  Kandra Nicolas, MD  VITAMIN E PO Take 1 capsule by mouth daily.    [provider]    Family History Family History  Problem Relation Age of Onset  . GER disease Mother   . Heart failure Father   . Cancer Maternal Grandfather   . CAD Maternal Uncle   . Cancer Paternal Aunt        "female cancer"  . Prostate cancer Maternal Uncle   . Leukemia Paternal Uncle   . Lung cancer Brother   . Heart disease Sister   . Colon cancer Neg Hx   . Esophageal cancer Neg Hx   . Stomach cancer Neg Hx     Social History Social History    Substance Use Topics  . Smoking status: Never Smoker  . Smokeless tobacco: Never Used  . Alcohol use No     Allergies   Macrobid [nitrofurantoin monohyd macro] and Codeine   Review of Systems Review of Systems  Constitutional: Negative.   HENT: Negative.   Eyes: Negative.   Respiratory: Negative.   Cardiovascular: Negative.   Gastrointestinal: Negative for abdominal distention, abdominal pain, nausea and vomiting.  Genitourinary: Positive for dysuria, frequency and urgency. Negative for difficulty urinating, flank pain and hematuria.  Musculoskeletal: Negative.   Skin: Negative.   Neurological: Negative for headaches.     Physical Exam Triage Vital Signs ED Triage Vitals  Enc Vitals Group     BP 05/12/17 1628 (!) 159/88     Pulse Rate 05/12/17 1628 74     Resp --      Temp 05/12/17 1628 97.8 F (36.6 C)     Temp Source 05/12/17 1628 Oral     SpO2 05/12/17 1628 99 %     Weight 05/12/17 1629 136 lb (61.7 kg)     Height 05/12/17 1629 5' (1.524 m)     Head Circumference --      Peak Flow --      Pain Score 05/12/17 1629 0     Pain Loc --      Pain Edu? --      Excl. in [place]? --    No data found.   Updated Vital Signs BP (!) 159/88 (BP Location: Left Arm)   Pulse 74   Temp 97.8 F (36.6 C) (Oral)   Ht 5' (1.524 m)   Wt 136 lb (61.7 kg)   SpO2 99%   BMI 26.56 kg/m   Visual Acuity Right Eye Distance:   Left Eye Distance:   Bilateral Distance:    Right Eye Near:   Left Eye Near:    Bilateral Near:     Physical Exam Nursing notes and Vital Signs reviewed. Appearance:  Patient appears stated age, and in no acute distress.    Eyes:  Pupils are equal, round, and reactive to light and accomodation.  Extraocular movement is intact.  Conjunctivae are not inflamed   Pharynx:  Normal; moist mucous membranes  Neck:  Supple.  No adenopathy Lungs:  Clear to auscultation.  Breath sounds are equal.  Moving air well. Heart:  Regular rate and rhythm without  murmurs, rubs, or gallops.  Abdomen:  Nontender without masses or hepatosplenomegaly.  Bowel sounds are present.  No CVA or flank tenderness.  Extremities:  No edema.  Skin:  No rash  present.     UC Treatments / Results  Labs (all labs ordered are listed, but only abnormal results are displayed) Labs Reviewed  POCT URINALYSIS DIP (MANUAL ENTRY) - Abnormal; Notable for the following:       Result Value   Blood, UA moderate (*)    Leukocytes, UA Trace (*)    All other components within normal limits  URINE CULTURE    EKG  EKG Interpretation None       Radiology No results found.  Procedures Procedures (including critical care time)  Medications Ordered in UC Medications - No data to display   Initial Impression / Assessment and Plan / UC Course  I have reviewed the triage vital signs and the nursing notes.  Pertinent labs & imaging results that were available during my care of the patient were reviewed by me and considered in my medical decision making (see chart for details).    Patient requests Rx for Vantin, which had been prescribed in the past.  However, insurance will provide coverage for Omnicef 300mg  BID.  Urine culture pending. Continue adequate fluid intake. If symptoms become significantly worse during the night or over the weekend, proceed to the local emergency room.  Followup with Dr. Elnoria Howard as scheduled.    Final Clinical Impressions(s) / UC Diagnoses   Final diagnoses:  Dysuria    New Prescriptions New Prescriptions   CEFDINIR (OMNICEF) 300 MG CAPSULE    Take 1 capsule (300 mg total) by mouth 2 (two) times daily.        Kandra Nicolas, MD 05/15/17 [phone]

## 2017-05-13 LAB — URINE CULTURE

## 2017-05-14 ENCOUNTER — Telehealth: Payer: Self-pay | Admitting: Emergency Medicine

## 2017-05-14 NOTE — Telephone Encounter (Signed)
Patient informed of urine culture results. 

## 2017-05-16 DIAGNOSIS — R319 Hematuria, unspecified: Secondary | ICD-10-CM | POA: Diagnosis not present

## 2017-05-18 DIAGNOSIS — E871 Hypo-osmolality and hyponatremia: Secondary | ICD-10-CM | POA: Diagnosis not present

## 2017-05-18 DIAGNOSIS — D649 Anemia, unspecified: Secondary | ICD-10-CM | POA: Diagnosis not present

## 2017-05-26 DIAGNOSIS — R319 Hematuria, unspecified: Secondary | ICD-10-CM | POA: Diagnosis not present

## 2017-06-09 DIAGNOSIS — I10 Essential (primary) hypertension: Secondary | ICD-10-CM | POA: Diagnosis not present

## 2017-06-09 DIAGNOSIS — E222 Syndrome of inappropriate secretion of antidiuretic hormone: Secondary | ICD-10-CM | POA: Diagnosis not present

## 2017-06-09 DIAGNOSIS — R319 Hematuria, unspecified: Secondary | ICD-10-CM | POA: Diagnosis not present

## 2017-06-09 DIAGNOSIS — R42 Dizziness and giddiness: Secondary | ICD-10-CM | POA: Diagnosis not present

## 2017-06-14 DIAGNOSIS — L570 Actinic keratosis: Secondary | ICD-10-CM | POA: Diagnosis not present

## 2017-06-14 DIAGNOSIS — Z08 Encounter for follow-up examination after completed treatment for malignant neoplasm: Secondary | ICD-10-CM | POA: Diagnosis not present

## 2017-06-14 DIAGNOSIS — Z85828 Personal history of other malignant neoplasm of skin: Secondary | ICD-10-CM | POA: Diagnosis not present

## 2017-06-15 DIAGNOSIS — E871 Hypo-osmolality and hyponatremia: Secondary | ICD-10-CM | POA: Diagnosis not present

## 2017-06-20 DIAGNOSIS — R319 Hematuria, unspecified: Secondary | ICD-10-CM | POA: Diagnosis not present

## 2017-06-22 DIAGNOSIS — E871 Hypo-osmolality and hyponatremia: Secondary | ICD-10-CM | POA: Diagnosis not present

## 2017-06-22 DIAGNOSIS — I10 Essential (primary) hypertension: Secondary | ICD-10-CM | POA: Diagnosis not present

## 2017-06-27 DIAGNOSIS — E871 Hypo-osmolality and hyponatremia: Secondary | ICD-10-CM | POA: Diagnosis not present

## 2017-07-06 DIAGNOSIS — Z23 Encounter for immunization: Secondary | ICD-10-CM | POA: Diagnosis not present

## 2017-07-19 DIAGNOSIS — L570 Actinic keratosis: Secondary | ICD-10-CM | POA: Diagnosis not present

## 2017-07-19 DIAGNOSIS — L820 Inflamed seborrheic keratosis: Secondary | ICD-10-CM | POA: Diagnosis not present

## 2017-07-19 DIAGNOSIS — D485 Neoplasm of uncertain behavior of skin: Secondary | ICD-10-CM | POA: Diagnosis not present

## 2017-07-28 DIAGNOSIS — R58 Hemorrhage, not elsewhere classified: Secondary | ICD-10-CM | POA: Diagnosis not present

## 2017-07-28 DIAGNOSIS — Z87440 Personal history of urinary (tract) infections: Secondary | ICD-10-CM | POA: Diagnosis not present

## 2017-08-25 DIAGNOSIS — I10 Essential (primary) hypertension: Secondary | ICD-10-CM | POA: Diagnosis not present

## 2017-08-25 DIAGNOSIS — M899 Disorder of bone, unspecified: Secondary | ICD-10-CM | POA: Diagnosis not present

## 2017-08-25 DIAGNOSIS — R5383 Other fatigue: Secondary | ICD-10-CM | POA: Diagnosis not present

## 2017-08-25 DIAGNOSIS — Z0000 Encounter for general adult medical examination without abnormal findings: Secondary | ICD-10-CM | POA: Diagnosis not present

## 2017-08-25 DIAGNOSIS — Z1231 Encounter for screening mammogram for malignant neoplasm of breast: Secondary | ICD-10-CM | POA: Diagnosis not present

## 2017-08-25 DIAGNOSIS — E871 Hypo-osmolality and hyponatremia: Secondary | ICD-10-CM | POA: Diagnosis not present

## 2017-08-25 DIAGNOSIS — E7800 Pure hypercholesterolemia, unspecified: Secondary | ICD-10-CM | POA: Diagnosis not present

## 2017-08-25 DIAGNOSIS — M810 Age-related osteoporosis without current pathological fracture: Secondary | ICD-10-CM | POA: Diagnosis not present

## 2017-08-25 DIAGNOSIS — E222 Syndrome of inappropriate secretion of antidiuretic hormone: Secondary | ICD-10-CM | POA: Diagnosis not present

## 2017-09-14 DIAGNOSIS — M899 Disorder of bone, unspecified: Secondary | ICD-10-CM | POA: Diagnosis not present

## 2017-09-14 DIAGNOSIS — M810 Age-related osteoporosis without current pathological fracture: Secondary | ICD-10-CM | POA: Diagnosis not present

## 2017-09-14 DIAGNOSIS — R5383 Other fatigue: Secondary | ICD-10-CM | POA: Diagnosis not present

## 2017-09-14 DIAGNOSIS — E7800 Pure hypercholesterolemia, unspecified: Secondary | ICD-10-CM | POA: Diagnosis not present

## 2017-09-14 DIAGNOSIS — E871 Hypo-osmolality and hyponatremia: Secondary | ICD-10-CM | POA: Diagnosis not present

## 2017-09-14 DIAGNOSIS — E222 Syndrome of inappropriate secretion of antidiuretic hormone: Secondary | ICD-10-CM | POA: Diagnosis not present

## 2017-09-14 DIAGNOSIS — I10 Essential (primary) hypertension: Secondary | ICD-10-CM | POA: Diagnosis not present

## 2017-09-19 DIAGNOSIS — C55 Malignant neoplasm of uterus, part unspecified: Secondary | ICD-10-CM

## 2017-09-19 HISTORY — DX: Malignant neoplasm of uterus, part unspecified: C55

## 2017-10-12 DIAGNOSIS — H5210 Myopia, unspecified eye: Secondary | ICD-10-CM | POA: Diagnosis not present

## 2017-10-12 DIAGNOSIS — E7800 Pure hypercholesterolemia, unspecified: Secondary | ICD-10-CM | POA: Diagnosis not present

## 2017-10-12 DIAGNOSIS — I10 Essential (primary) hypertension: Secondary | ICD-10-CM | POA: Diagnosis not present

## 2017-10-13 DIAGNOSIS — M810 Age-related osteoporosis without current pathological fracture: Secondary | ICD-10-CM | POA: Diagnosis not present

## 2017-10-13 DIAGNOSIS — Z780 Asymptomatic menopausal state: Secondary | ICD-10-CM | POA: Diagnosis not present

## 2017-10-23 DIAGNOSIS — H524 Presbyopia: Secondary | ICD-10-CM | POA: Diagnosis not present

## 2017-10-23 DIAGNOSIS — H5213 Myopia, bilateral: Secondary | ICD-10-CM | POA: Diagnosis not present

## 2017-10-23 DIAGNOSIS — H52209 Unspecified astigmatism, unspecified eye: Secondary | ICD-10-CM | POA: Diagnosis not present

## 2017-10-26 DIAGNOSIS — R319 Hematuria, unspecified: Secondary | ICD-10-CM | POA: Diagnosis not present

## 2017-11-23 DIAGNOSIS — C541 Malignant neoplasm of endometrium: Secondary | ICD-10-CM | POA: Diagnosis not present

## 2017-11-23 DIAGNOSIS — N950 Postmenopausal bleeding: Secondary | ICD-10-CM | POA: Diagnosis not present

## 2017-11-30 DIAGNOSIS — C541 Malignant neoplasm of endometrium: Secondary | ICD-10-CM | POA: Diagnosis not present

## 2017-12-04 DIAGNOSIS — C55 Malignant neoplasm of uterus, part unspecified: Secondary | ICD-10-CM | POA: Diagnosis not present

## 2017-12-04 DIAGNOSIS — D1771 Benign lipomatous neoplasm of kidney: Secondary | ICD-10-CM | POA: Diagnosis not present

## 2017-12-06 DIAGNOSIS — C541 Malignant neoplasm of endometrium: Secondary | ICD-10-CM | POA: Diagnosis not present

## 2018-01-01 ENCOUNTER — Encounter: Payer: Self-pay | Admitting: Internal Medicine

## 2018-01-04 DIAGNOSIS — C55 Malignant neoplasm of uterus, part unspecified: Secondary | ICD-10-CM | POA: Diagnosis not present

## 2018-01-04 DIAGNOSIS — Z01812 Encounter for preprocedural laboratory examination: Secondary | ICD-10-CM | POA: Diagnosis not present

## 2018-01-12 DIAGNOSIS — Z96641 Presence of right artificial hip joint: Secondary | ICD-10-CM | POA: Diagnosis not present

## 2018-01-12 DIAGNOSIS — I10 Essential (primary) hypertension: Secondary | ICD-10-CM | POA: Diagnosis not present

## 2018-01-12 DIAGNOSIS — Z79899 Other long term (current) drug therapy: Secondary | ICD-10-CM | POA: Diagnosis not present

## 2018-01-12 DIAGNOSIS — K219 Gastro-esophageal reflux disease without esophagitis: Secondary | ICD-10-CM | POA: Diagnosis not present

## 2018-01-12 DIAGNOSIS — Z881 Allergy status to other antibiotic agents status: Secondary | ICD-10-CM | POA: Diagnosis not present

## 2018-01-12 DIAGNOSIS — E222 Syndrome of inappropriate secretion of antidiuretic hormone: Secondary | ICD-10-CM | POA: Diagnosis not present

## 2018-01-12 DIAGNOSIS — Z885 Allergy status to narcotic agent status: Secondary | ICD-10-CM | POA: Diagnosis not present

## 2018-01-12 DIAGNOSIS — E785 Hyperlipidemia, unspecified: Secondary | ICD-10-CM | POA: Diagnosis not present

## 2018-01-12 DIAGNOSIS — C55 Malignant neoplasm of uterus, part unspecified: Secondary | ICD-10-CM | POA: Diagnosis not present

## 2018-01-12 DIAGNOSIS — C541 Malignant neoplasm of endometrium: Secondary | ICD-10-CM | POA: Diagnosis not present

## 2018-01-13 DIAGNOSIS — C55 Malignant neoplasm of uterus, part unspecified: Secondary | ICD-10-CM | POA: Diagnosis not present

## 2018-01-13 DIAGNOSIS — K219 Gastro-esophageal reflux disease without esophagitis: Secondary | ICD-10-CM | POA: Diagnosis not present

## 2018-01-13 DIAGNOSIS — Z881 Allergy status to other antibiotic agents status: Secondary | ICD-10-CM | POA: Diagnosis not present

## 2018-01-13 DIAGNOSIS — E222 Syndrome of inappropriate secretion of antidiuretic hormone: Secondary | ICD-10-CM | POA: Diagnosis not present

## 2018-01-13 DIAGNOSIS — I10 Essential (primary) hypertension: Secondary | ICD-10-CM | POA: Diagnosis not present

## 2018-01-13 DIAGNOSIS — Z96641 Presence of right artificial hip joint: Secondary | ICD-10-CM | POA: Diagnosis not present

## 2018-01-13 DIAGNOSIS — Z79899 Other long term (current) drug therapy: Secondary | ICD-10-CM | POA: Diagnosis not present

## 2018-01-13 DIAGNOSIS — E785 Hyperlipidemia, unspecified: Secondary | ICD-10-CM | POA: Diagnosis not present

## 2018-01-13 DIAGNOSIS — Z885 Allergy status to narcotic agent status: Secondary | ICD-10-CM | POA: Diagnosis not present

## 2018-01-24 DIAGNOSIS — E785 Hyperlipidemia, unspecified: Secondary | ICD-10-CM | POA: Diagnosis not present

## 2018-01-24 DIAGNOSIS — I10 Essential (primary) hypertension: Secondary | ICD-10-CM | POA: Diagnosis not present

## 2018-01-24 DIAGNOSIS — E222 Syndrome of inappropriate secretion of antidiuretic hormone: Secondary | ICD-10-CM | POA: Diagnosis not present

## 2018-01-24 DIAGNOSIS — C541 Malignant neoplasm of endometrium: Secondary | ICD-10-CM | POA: Diagnosis not present

## 2018-01-24 DIAGNOSIS — Z90710 Acquired absence of both cervix and uterus: Secondary | ICD-10-CM | POA: Diagnosis not present

## 2018-01-24 DIAGNOSIS — M810 Age-related osteoporosis without current pathological fracture: Secondary | ICD-10-CM | POA: Diagnosis not present

## 2018-02-21 DIAGNOSIS — Z90722 Acquired absence of ovaries, bilateral: Secondary | ICD-10-CM | POA: Diagnosis not present

## 2018-02-21 DIAGNOSIS — E785 Hyperlipidemia, unspecified: Secondary | ICD-10-CM | POA: Diagnosis not present

## 2018-02-21 DIAGNOSIS — M810 Age-related osteoporosis without current pathological fracture: Secondary | ICD-10-CM | POA: Diagnosis not present

## 2018-02-21 DIAGNOSIS — C541 Malignant neoplasm of endometrium: Secondary | ICD-10-CM | POA: Diagnosis not present

## 2018-02-21 DIAGNOSIS — E222 Syndrome of inappropriate secretion of antidiuretic hormone: Secondary | ICD-10-CM | POA: Diagnosis not present

## 2018-02-21 DIAGNOSIS — I10 Essential (primary) hypertension: Secondary | ICD-10-CM | POA: Diagnosis not present

## 2018-02-21 DIAGNOSIS — Z90710 Acquired absence of both cervix and uterus: Secondary | ICD-10-CM | POA: Diagnosis not present

## 2018-02-28 DIAGNOSIS — Z85828 Personal history of other malignant neoplasm of skin: Secondary | ICD-10-CM | POA: Diagnosis not present

## 2018-02-28 DIAGNOSIS — L570 Actinic keratosis: Secondary | ICD-10-CM | POA: Diagnosis not present

## 2018-02-28 DIAGNOSIS — L821 Other seborrheic keratosis: Secondary | ICD-10-CM | POA: Diagnosis not present

## 2018-02-28 DIAGNOSIS — Z08 Encounter for follow-up examination after completed treatment for malignant neoplasm: Secondary | ICD-10-CM | POA: Diagnosis not present

## 2018-03-09 DIAGNOSIS — E7800 Pure hypercholesterolemia, unspecified: Secondary | ICD-10-CM | POA: Diagnosis not present

## 2018-03-09 DIAGNOSIS — M810 Age-related osteoporosis without current pathological fracture: Secondary | ICD-10-CM | POA: Diagnosis not present

## 2018-03-09 DIAGNOSIS — I10 Essential (primary) hypertension: Secondary | ICD-10-CM | POA: Diagnosis not present

## 2018-03-09 DIAGNOSIS — E222 Syndrome of inappropriate secretion of antidiuretic hormone: Secondary | ICD-10-CM | POA: Diagnosis not present

## 2018-05-04 DIAGNOSIS — Z1231 Encounter for screening mammogram for malignant neoplasm of breast: Secondary | ICD-10-CM | POA: Diagnosis not present

## 2018-05-16 DIAGNOSIS — E871 Hypo-osmolality and hyponatremia: Secondary | ICD-10-CM | POA: Diagnosis not present

## 2018-05-16 DIAGNOSIS — H6980 Other specified disorders of Eustachian tube, unspecified ear: Secondary | ICD-10-CM | POA: Diagnosis not present

## 2018-05-16 DIAGNOSIS — R42 Dizziness and giddiness: Secondary | ICD-10-CM | POA: Diagnosis not present

## 2018-05-16 DIAGNOSIS — I10 Essential (primary) hypertension: Secondary | ICD-10-CM | POA: Diagnosis not present

## 2018-05-16 DIAGNOSIS — E222 Syndrome of inappropriate secretion of antidiuretic hormone: Secondary | ICD-10-CM | POA: Diagnosis not present

## 2018-05-17 DIAGNOSIS — I10 Essential (primary) hypertension: Secondary | ICD-10-CM | POA: Diagnosis not present

## 2018-05-17 DIAGNOSIS — E871 Hypo-osmolality and hyponatremia: Secondary | ICD-10-CM | POA: Diagnosis not present

## 2018-05-23 DIAGNOSIS — C541 Malignant neoplasm of endometrium: Secondary | ICD-10-CM | POA: Diagnosis not present

## 2018-05-24 DIAGNOSIS — Z885 Allergy status to narcotic agent status: Secondary | ICD-10-CM | POA: Diagnosis not present

## 2018-05-24 DIAGNOSIS — R51 Headache: Secondary | ICD-10-CM | POA: Diagnosis not present

## 2018-05-24 DIAGNOSIS — E871 Hypo-osmolality and hyponatremia: Secondary | ICD-10-CM | POA: Diagnosis not present

## 2018-05-24 DIAGNOSIS — Z87440 Personal history of urinary (tract) infections: Secondary | ICD-10-CM | POA: Diagnosis not present

## 2018-05-24 DIAGNOSIS — K219 Gastro-esophageal reflux disease without esophagitis: Secondary | ICD-10-CM | POA: Diagnosis not present

## 2018-05-24 DIAGNOSIS — I160 Hypertensive urgency: Secondary | ICD-10-CM | POA: Diagnosis not present

## 2018-05-24 DIAGNOSIS — Z96641 Presence of right artificial hip joint: Secondary | ICD-10-CM | POA: Diagnosis not present

## 2018-05-24 DIAGNOSIS — I10 Essential (primary) hypertension: Secondary | ICD-10-CM | POA: Diagnosis not present

## 2018-05-24 DIAGNOSIS — Z888 Allergy status to other drugs, medicaments and biological substances status: Secondary | ICD-10-CM | POA: Diagnosis not present

## 2018-05-24 DIAGNOSIS — R42 Dizziness and giddiness: Secondary | ICD-10-CM | POA: Diagnosis not present

## 2018-05-24 DIAGNOSIS — E785 Hyperlipidemia, unspecified: Secondary | ICD-10-CM | POA: Diagnosis not present

## 2018-05-30 DIAGNOSIS — E875 Hyperkalemia: Secondary | ICD-10-CM | POA: Diagnosis not present

## 2018-05-30 DIAGNOSIS — I10 Essential (primary) hypertension: Secondary | ICD-10-CM | POA: Diagnosis not present

## 2018-05-30 DIAGNOSIS — R030 Elevated blood-pressure reading, without diagnosis of hypertension: Secondary | ICD-10-CM | POA: Diagnosis not present

## 2018-05-30 DIAGNOSIS — E222 Syndrome of inappropriate secretion of antidiuretic hormone: Secondary | ICD-10-CM | POA: Diagnosis not present

## 2018-05-30 DIAGNOSIS — R42 Dizziness and giddiness: Secondary | ICD-10-CM | POA: Diagnosis not present

## 2018-06-04 IMAGING — CR DG ABD PORTABLE 1V
1 series · 1 of 1 positions shown · non-contrast
Comparison: 03/19/2008

CLINICAL DATA: Nausea, vomiting.

EXAM:
PORTABLE ABDOMEN - 1 VIEW

[AP]
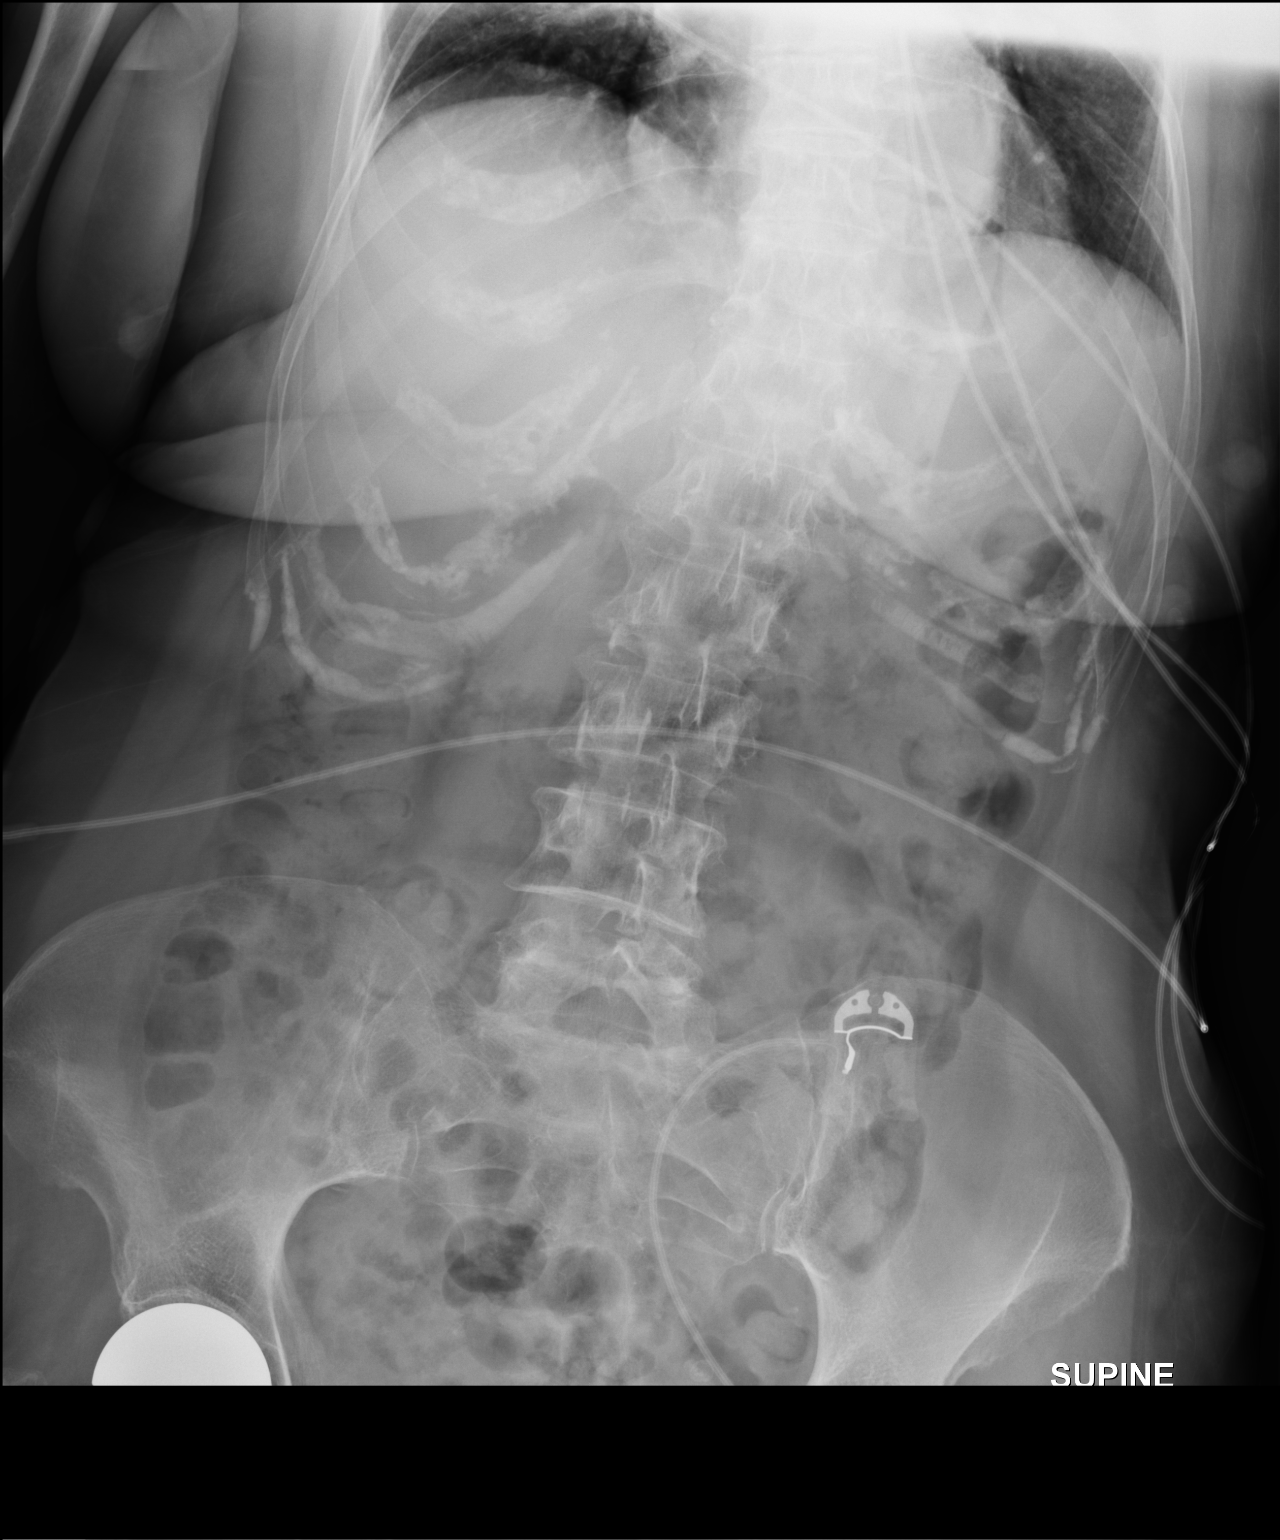

[1 of 1 positions shown; findings below may reference images not displayed]

FINDINGS: Moderate stool burden throughout the colon. Nonobstructive bowel gas
pattern. No free air, organomegaly or suspicious calcification.
Prior right hip replacement. No acute bony abnormality. Visualized
lung bases clear.
IMPRESSION: Moderate stool burden.  No acute findings.

## 2018-06-06 DIAGNOSIS — R030 Elevated blood-pressure reading, without diagnosis of hypertension: Secondary | ICD-10-CM | POA: Diagnosis not present

## 2018-06-06 DIAGNOSIS — R42 Dizziness and giddiness: Secondary | ICD-10-CM | POA: Diagnosis not present

## 2018-06-06 DIAGNOSIS — E871 Hypo-osmolality and hyponatremia: Secondary | ICD-10-CM | POA: Diagnosis not present

## 2018-06-14 DIAGNOSIS — E875 Hyperkalemia: Secondary | ICD-10-CM | POA: Diagnosis not present

## 2018-07-04 DIAGNOSIS — E875 Hyperkalemia: Secondary | ICD-10-CM | POA: Diagnosis not present

## 2018-07-05 DIAGNOSIS — I10 Essential (primary) hypertension: Secondary | ICD-10-CM | POA: Diagnosis not present

## 2018-07-05 DIAGNOSIS — E222 Syndrome of inappropriate secretion of antidiuretic hormone: Secondary | ICD-10-CM | POA: Diagnosis not present

## 2018-07-05 DIAGNOSIS — E871 Hypo-osmolality and hyponatremia: Secondary | ICD-10-CM | POA: Diagnosis not present

## 2018-07-05 DIAGNOSIS — R42 Dizziness and giddiness: Secondary | ICD-10-CM | POA: Diagnosis not present

## 2018-07-11 DIAGNOSIS — I6523 Occlusion and stenosis of bilateral carotid arteries: Secondary | ICD-10-CM | POA: Diagnosis not present

## 2018-07-11 DIAGNOSIS — R42 Dizziness and giddiness: Secondary | ICD-10-CM | POA: Diagnosis not present

## 2018-07-19 DIAGNOSIS — Z23 Encounter for immunization: Secondary | ICD-10-CM | POA: Diagnosis not present

## 2018-09-03 DIAGNOSIS — M170 Bilateral primary osteoarthritis of knee: Secondary | ICD-10-CM | POA: Diagnosis not present

## 2018-09-03 DIAGNOSIS — M25562 Pain in left knee: Secondary | ICD-10-CM | POA: Diagnosis not present

## 2018-09-03 DIAGNOSIS — M25561 Pain in right knee: Secondary | ICD-10-CM | POA: Diagnosis not present

## 2018-09-04 DIAGNOSIS — I10 Essential (primary) hypertension: Secondary | ICD-10-CM | POA: Diagnosis not present

## 2018-09-04 DIAGNOSIS — E7800 Pure hypercholesterolemia, unspecified: Secondary | ICD-10-CM | POA: Diagnosis not present

## 2018-09-04 DIAGNOSIS — R5383 Other fatigue: Secondary | ICD-10-CM | POA: Diagnosis not present

## 2018-09-04 DIAGNOSIS — M810 Age-related osteoporosis without current pathological fracture: Secondary | ICD-10-CM | POA: Diagnosis not present

## 2018-09-04 DIAGNOSIS — E222 Syndrome of inappropriate secretion of antidiuretic hormone: Secondary | ICD-10-CM | POA: Diagnosis not present

## 2018-09-04 DIAGNOSIS — Z0000 Encounter for general adult medical examination without abnormal findings: Secondary | ICD-10-CM | POA: Diagnosis not present

## 2018-09-04 DIAGNOSIS — E871 Hypo-osmolality and hyponatremia: Secondary | ICD-10-CM | POA: Diagnosis not present

## 2018-09-04 DIAGNOSIS — M899 Disorder of bone, unspecified: Secondary | ICD-10-CM | POA: Diagnosis not present

## 2018-09-04 DIAGNOSIS — Z87440 Personal history of urinary (tract) infections: Secondary | ICD-10-CM | POA: Diagnosis not present

## 2018-09-26 DIAGNOSIS — C541 Malignant neoplasm of endometrium: Secondary | ICD-10-CM | POA: Diagnosis not present

## 2018-10-02 DIAGNOSIS — C541 Malignant neoplasm of endometrium: Secondary | ICD-10-CM | POA: Diagnosis not present

## 2018-10-04 DIAGNOSIS — Z1211 Encounter for screening for malignant neoplasm of colon: Secondary | ICD-10-CM | POA: Diagnosis not present

## 2018-10-09 DIAGNOSIS — C541 Malignant neoplasm of endometrium: Secondary | ICD-10-CM | POA: Diagnosis not present

## 2018-10-11 DIAGNOSIS — Z90711 Acquired absence of uterus with remaining cervical stump: Secondary | ICD-10-CM | POA: Diagnosis not present

## 2018-10-11 DIAGNOSIS — C7982 Secondary malignant neoplasm of genital organs: Secondary | ICD-10-CM | POA: Diagnosis not present

## 2018-10-11 DIAGNOSIS — Z808 Family history of malignant neoplasm of other organs or systems: Secondary | ICD-10-CM | POA: Diagnosis not present

## 2018-10-11 DIAGNOSIS — C541 Malignant neoplasm of endometrium: Secondary | ICD-10-CM | POA: Diagnosis not present

## 2018-10-16 DIAGNOSIS — C541 Malignant neoplasm of endometrium: Secondary | ICD-10-CM | POA: Diagnosis not present

## 2018-10-16 DIAGNOSIS — Z510 Encounter for antineoplastic radiation therapy: Secondary | ICD-10-CM | POA: Diagnosis not present

## 2018-10-16 DIAGNOSIS — C7982 Secondary malignant neoplasm of genital organs: Secondary | ICD-10-CM | POA: Diagnosis not present

## 2018-10-24 DIAGNOSIS — C541 Malignant neoplasm of endometrium: Secondary | ICD-10-CM | POA: Diagnosis not present

## 2018-10-24 DIAGNOSIS — C7982 Secondary malignant neoplasm of genital organs: Secondary | ICD-10-CM | POA: Diagnosis not present

## 2018-11-01 DIAGNOSIS — C541 Malignant neoplasm of endometrium: Secondary | ICD-10-CM | POA: Diagnosis not present

## 2018-11-01 DIAGNOSIS — Z510 Encounter for antineoplastic radiation therapy: Secondary | ICD-10-CM | POA: Diagnosis not present

## 2018-11-01 DIAGNOSIS — C7982 Secondary malignant neoplasm of genital organs: Secondary | ICD-10-CM | POA: Diagnosis not present

## 2018-11-08 DIAGNOSIS — C541 Malignant neoplasm of endometrium: Secondary | ICD-10-CM | POA: Diagnosis not present

## 2018-11-08 DIAGNOSIS — C7982 Secondary malignant neoplasm of genital organs: Secondary | ICD-10-CM | POA: Diagnosis not present

## 2018-11-08 DIAGNOSIS — Z510 Encounter for antineoplastic radiation therapy: Secondary | ICD-10-CM | POA: Diagnosis not present

## 2018-11-09 DIAGNOSIS — C7982 Secondary malignant neoplasm of genital organs: Secondary | ICD-10-CM | POA: Diagnosis not present

## 2018-11-09 DIAGNOSIS — Z510 Encounter for antineoplastic radiation therapy: Secondary | ICD-10-CM | POA: Diagnosis not present

## 2018-11-09 DIAGNOSIS — C541 Malignant neoplasm of endometrium: Secondary | ICD-10-CM | POA: Diagnosis not present

## 2018-11-12 DIAGNOSIS — C7982 Secondary malignant neoplasm of genital organs: Secondary | ICD-10-CM | POA: Diagnosis not present

## 2018-11-12 DIAGNOSIS — C541 Malignant neoplasm of endometrium: Secondary | ICD-10-CM | POA: Diagnosis not present

## 2018-11-12 DIAGNOSIS — Z510 Encounter for antineoplastic radiation therapy: Secondary | ICD-10-CM | POA: Diagnosis not present

## 2018-11-13 DIAGNOSIS — C541 Malignant neoplasm of endometrium: Secondary | ICD-10-CM | POA: Diagnosis not present

## 2018-11-13 DIAGNOSIS — C7982 Secondary malignant neoplasm of genital organs: Secondary | ICD-10-CM | POA: Diagnosis not present

## 2018-11-13 DIAGNOSIS — Z510 Encounter for antineoplastic radiation therapy: Secondary | ICD-10-CM | POA: Diagnosis not present

## 2018-11-14 DIAGNOSIS — C541 Malignant neoplasm of endometrium: Secondary | ICD-10-CM | POA: Diagnosis not present

## 2018-11-14 DIAGNOSIS — Z510 Encounter for antineoplastic radiation therapy: Secondary | ICD-10-CM | POA: Diagnosis not present

## 2018-11-14 DIAGNOSIS — C7982 Secondary malignant neoplasm of genital organs: Secondary | ICD-10-CM | POA: Diagnosis not present

## 2018-11-15 DIAGNOSIS — C541 Malignant neoplasm of endometrium: Secondary | ICD-10-CM | POA: Diagnosis not present

## 2018-11-15 DIAGNOSIS — C7982 Secondary malignant neoplasm of genital organs: Secondary | ICD-10-CM | POA: Diagnosis not present

## 2018-11-15 DIAGNOSIS — Z510 Encounter for antineoplastic radiation therapy: Secondary | ICD-10-CM | POA: Diagnosis not present

## 2018-11-16 DIAGNOSIS — C7982 Secondary malignant neoplasm of genital organs: Secondary | ICD-10-CM | POA: Diagnosis not present

## 2018-11-16 DIAGNOSIS — C541 Malignant neoplasm of endometrium: Secondary | ICD-10-CM | POA: Diagnosis not present

## 2018-11-16 DIAGNOSIS — Z510 Encounter for antineoplastic radiation therapy: Secondary | ICD-10-CM | POA: Diagnosis not present

## 2018-11-19 DIAGNOSIS — Z510 Encounter for antineoplastic radiation therapy: Secondary | ICD-10-CM | POA: Diagnosis not present

## 2018-11-19 DIAGNOSIS — C7982 Secondary malignant neoplasm of genital organs: Secondary | ICD-10-CM | POA: Diagnosis not present

## 2018-11-19 DIAGNOSIS — C541 Malignant neoplasm of endometrium: Secondary | ICD-10-CM | POA: Diagnosis not present

## 2018-11-20 DIAGNOSIS — Z510 Encounter for antineoplastic radiation therapy: Secondary | ICD-10-CM | POA: Diagnosis not present

## 2018-11-20 DIAGNOSIS — C541 Malignant neoplasm of endometrium: Secondary | ICD-10-CM | POA: Diagnosis not present

## 2018-11-20 DIAGNOSIS — C7982 Secondary malignant neoplasm of genital organs: Secondary | ICD-10-CM | POA: Diagnosis not present

## 2018-11-21 DIAGNOSIS — C541 Malignant neoplasm of endometrium: Secondary | ICD-10-CM | POA: Diagnosis not present

## 2018-11-21 DIAGNOSIS — C7982 Secondary malignant neoplasm of genital organs: Secondary | ICD-10-CM | POA: Diagnosis not present

## 2018-11-21 DIAGNOSIS — Z510 Encounter for antineoplastic radiation therapy: Secondary | ICD-10-CM | POA: Diagnosis not present

## 2018-11-22 DIAGNOSIS — C541 Malignant neoplasm of endometrium: Secondary | ICD-10-CM | POA: Diagnosis not present

## 2018-11-22 DIAGNOSIS — C7982 Secondary malignant neoplasm of genital organs: Secondary | ICD-10-CM | POA: Diagnosis not present

## 2018-11-22 DIAGNOSIS — Z510 Encounter for antineoplastic radiation therapy: Secondary | ICD-10-CM | POA: Diagnosis not present

## 2018-11-23 DIAGNOSIS — C541 Malignant neoplasm of endometrium: Secondary | ICD-10-CM | POA: Diagnosis not present

## 2018-11-23 DIAGNOSIS — C7982 Secondary malignant neoplasm of genital organs: Secondary | ICD-10-CM | POA: Diagnosis not present

## 2018-11-23 DIAGNOSIS — Z510 Encounter for antineoplastic radiation therapy: Secondary | ICD-10-CM | POA: Diagnosis not present

## 2018-11-27 DIAGNOSIS — C541 Malignant neoplasm of endometrium: Secondary | ICD-10-CM | POA: Diagnosis not present

## 2018-11-27 DIAGNOSIS — Z510 Encounter for antineoplastic radiation therapy: Secondary | ICD-10-CM | POA: Diagnosis not present

## 2018-11-27 DIAGNOSIS — C7982 Secondary malignant neoplasm of genital organs: Secondary | ICD-10-CM | POA: Diagnosis not present

## 2018-11-28 DIAGNOSIS — C541 Malignant neoplasm of endometrium: Secondary | ICD-10-CM | POA: Diagnosis not present

## 2018-11-28 DIAGNOSIS — Z510 Encounter for antineoplastic radiation therapy: Secondary | ICD-10-CM | POA: Diagnosis not present

## 2018-11-28 DIAGNOSIS — C7982 Secondary malignant neoplasm of genital organs: Secondary | ICD-10-CM | POA: Diagnosis not present

## 2018-11-29 DIAGNOSIS — C541 Malignant neoplasm of endometrium: Secondary | ICD-10-CM | POA: Diagnosis not present

## 2018-11-29 DIAGNOSIS — C7982 Secondary malignant neoplasm of genital organs: Secondary | ICD-10-CM | POA: Diagnosis not present

## 2018-11-29 DIAGNOSIS — Z510 Encounter for antineoplastic radiation therapy: Secondary | ICD-10-CM | POA: Diagnosis not present

## 2018-11-30 DIAGNOSIS — C541 Malignant neoplasm of endometrium: Secondary | ICD-10-CM | POA: Diagnosis not present

## 2018-11-30 DIAGNOSIS — C7982 Secondary malignant neoplasm of genital organs: Secondary | ICD-10-CM | POA: Diagnosis not present

## 2018-11-30 DIAGNOSIS — Z510 Encounter for antineoplastic radiation therapy: Secondary | ICD-10-CM | POA: Diagnosis not present

## 2018-12-03 DIAGNOSIS — Z135 Encounter for screening for eye and ear disorders: Secondary | ICD-10-CM | POA: Diagnosis not present

## 2018-12-03 DIAGNOSIS — E7800 Pure hypercholesterolemia, unspecified: Secondary | ICD-10-CM | POA: Diagnosis not present

## 2018-12-03 DIAGNOSIS — H43812 Vitreous degeneration, left eye: Secondary | ICD-10-CM | POA: Diagnosis not present

## 2018-12-03 DIAGNOSIS — Z961 Presence of intraocular lens: Secondary | ICD-10-CM | POA: Diagnosis not present

## 2018-12-03 DIAGNOSIS — H04123 Dry eye syndrome of bilateral lacrimal glands: Secondary | ICD-10-CM | POA: Diagnosis not present

## 2018-12-03 DIAGNOSIS — H5210 Myopia, unspecified eye: Secondary | ICD-10-CM | POA: Diagnosis not present

## 2018-12-03 DIAGNOSIS — Z510 Encounter for antineoplastic radiation therapy: Secondary | ICD-10-CM | POA: Diagnosis not present

## 2018-12-03 DIAGNOSIS — C7982 Secondary malignant neoplasm of genital organs: Secondary | ICD-10-CM | POA: Diagnosis not present

## 2018-12-03 DIAGNOSIS — I10 Essential (primary) hypertension: Secondary | ICD-10-CM | POA: Diagnosis not present

## 2018-12-03 DIAGNOSIS — C541 Malignant neoplasm of endometrium: Secondary | ICD-10-CM | POA: Diagnosis not present

## 2018-12-04 DIAGNOSIS — C541 Malignant neoplasm of endometrium: Secondary | ICD-10-CM | POA: Diagnosis not present

## 2018-12-04 DIAGNOSIS — Z510 Encounter for antineoplastic radiation therapy: Secondary | ICD-10-CM | POA: Diagnosis not present

## 2018-12-04 DIAGNOSIS — C7982 Secondary malignant neoplasm of genital organs: Secondary | ICD-10-CM | POA: Diagnosis not present

## 2018-12-05 DIAGNOSIS — Z510 Encounter for antineoplastic radiation therapy: Secondary | ICD-10-CM | POA: Diagnosis not present

## 2018-12-05 DIAGNOSIS — C541 Malignant neoplasm of endometrium: Secondary | ICD-10-CM | POA: Diagnosis not present

## 2018-12-05 DIAGNOSIS — C7982 Secondary malignant neoplasm of genital organs: Secondary | ICD-10-CM | POA: Diagnosis not present

## 2018-12-06 DIAGNOSIS — Z510 Encounter for antineoplastic radiation therapy: Secondary | ICD-10-CM | POA: Diagnosis not present

## 2018-12-06 DIAGNOSIS — C7982 Secondary malignant neoplasm of genital organs: Secondary | ICD-10-CM | POA: Diagnosis not present

## 2018-12-06 DIAGNOSIS — C541 Malignant neoplasm of endometrium: Secondary | ICD-10-CM | POA: Diagnosis not present

## 2018-12-07 DIAGNOSIS — C541 Malignant neoplasm of endometrium: Secondary | ICD-10-CM | POA: Diagnosis not present

## 2018-12-07 DIAGNOSIS — C7982 Secondary malignant neoplasm of genital organs: Secondary | ICD-10-CM | POA: Diagnosis not present

## 2018-12-07 DIAGNOSIS — Z510 Encounter for antineoplastic radiation therapy: Secondary | ICD-10-CM | POA: Diagnosis not present

## 2018-12-10 DIAGNOSIS — Z510 Encounter for antineoplastic radiation therapy: Secondary | ICD-10-CM | POA: Diagnosis not present

## 2018-12-10 DIAGNOSIS — C7982 Secondary malignant neoplasm of genital organs: Secondary | ICD-10-CM | POA: Diagnosis not present

## 2018-12-10 DIAGNOSIS — C541 Malignant neoplasm of endometrium: Secondary | ICD-10-CM | POA: Diagnosis not present

## 2018-12-11 DIAGNOSIS — Z510 Encounter for antineoplastic radiation therapy: Secondary | ICD-10-CM | POA: Diagnosis not present

## 2018-12-11 DIAGNOSIS — C541 Malignant neoplasm of endometrium: Secondary | ICD-10-CM | POA: Diagnosis not present

## 2018-12-11 DIAGNOSIS — C7982 Secondary malignant neoplasm of genital organs: Secondary | ICD-10-CM | POA: Diagnosis not present

## 2018-12-12 DIAGNOSIS — C541 Malignant neoplasm of endometrium: Secondary | ICD-10-CM | POA: Diagnosis not present

## 2018-12-12 DIAGNOSIS — C7982 Secondary malignant neoplasm of genital organs: Secondary | ICD-10-CM | POA: Diagnosis not present

## 2018-12-12 DIAGNOSIS — Z510 Encounter for antineoplastic radiation therapy: Secondary | ICD-10-CM | POA: Diagnosis not present

## 2018-12-13 DIAGNOSIS — C541 Malignant neoplasm of endometrium: Secondary | ICD-10-CM | POA: Diagnosis not present

## 2018-12-13 DIAGNOSIS — Z510 Encounter for antineoplastic radiation therapy: Secondary | ICD-10-CM | POA: Diagnosis not present

## 2018-12-13 DIAGNOSIS — C7982 Secondary malignant neoplasm of genital organs: Secondary | ICD-10-CM | POA: Diagnosis not present

## 2018-12-20 DIAGNOSIS — Z510 Encounter for antineoplastic radiation therapy: Secondary | ICD-10-CM | POA: Diagnosis not present

## 2018-12-20 DIAGNOSIS — C541 Malignant neoplasm of endometrium: Secondary | ICD-10-CM | POA: Diagnosis not present

## 2018-12-31 DIAGNOSIS — Z510 Encounter for antineoplastic radiation therapy: Secondary | ICD-10-CM | POA: Diagnosis not present

## 2018-12-31 DIAGNOSIS — C541 Malignant neoplasm of endometrium: Secondary | ICD-10-CM | POA: Diagnosis not present

## 2019-01-03 DIAGNOSIS — C541 Malignant neoplasm of endometrium: Secondary | ICD-10-CM | POA: Diagnosis not present

## 2019-01-03 DIAGNOSIS — Z510 Encounter for antineoplastic radiation therapy: Secondary | ICD-10-CM | POA: Diagnosis not present

## 2019-01-07 DIAGNOSIS — C541 Malignant neoplasm of endometrium: Secondary | ICD-10-CM | POA: Diagnosis not present

## 2019-01-10 DIAGNOSIS — C541 Malignant neoplasm of endometrium: Secondary | ICD-10-CM | POA: Diagnosis not present

## 2019-01-10 DIAGNOSIS — Z510 Encounter for antineoplastic radiation therapy: Secondary | ICD-10-CM | POA: Diagnosis not present

## 2019-01-29 DIAGNOSIS — E875 Hyperkalemia: Secondary | ICD-10-CM | POA: Diagnosis not present

## 2019-01-30 DIAGNOSIS — Z79899 Other long term (current) drug therapy: Secondary | ICD-10-CM | POA: Diagnosis not present

## 2019-01-30 DIAGNOSIS — C541 Malignant neoplasm of endometrium: Secondary | ICD-10-CM | POA: Diagnosis not present

## 2019-01-30 DIAGNOSIS — Z9889 Other specified postprocedural states: Secondary | ICD-10-CM | POA: Diagnosis not present

## 2019-02-08 DIAGNOSIS — E871 Hypo-osmolality and hyponatremia: Secondary | ICD-10-CM | POA: Diagnosis not present

## 2019-02-08 DIAGNOSIS — E222 Syndrome of inappropriate secretion of antidiuretic hormone: Secondary | ICD-10-CM | POA: Diagnosis not present

## 2019-02-08 DIAGNOSIS — I10 Essential (primary) hypertension: Secondary | ICD-10-CM | POA: Diagnosis not present

## 2019-02-08 DIAGNOSIS — R42 Dizziness and giddiness: Secondary | ICD-10-CM | POA: Diagnosis not present

## 2019-02-13 DIAGNOSIS — M170 Bilateral primary osteoarthritis of knee: Secondary | ICD-10-CM | POA: Diagnosis not present

## 2019-02-14 DIAGNOSIS — E871 Hypo-osmolality and hyponatremia: Secondary | ICD-10-CM | POA: Diagnosis not present

## 2019-02-25 DIAGNOSIS — E871 Hypo-osmolality and hyponatremia: Secondary | ICD-10-CM | POA: Diagnosis not present

## 2019-02-25 DIAGNOSIS — R300 Dysuria: Secondary | ICD-10-CM | POA: Diagnosis not present

## 2019-02-25 DIAGNOSIS — I10 Essential (primary) hypertension: Secondary | ICD-10-CM | POA: Diagnosis not present

## 2019-02-25 DIAGNOSIS — E222 Syndrome of inappropriate secretion of antidiuretic hormone: Secondary | ICD-10-CM | POA: Diagnosis not present

## 2019-02-25 DIAGNOSIS — Z87440 Personal history of urinary (tract) infections: Secondary | ICD-10-CM | POA: Diagnosis not present

## 2019-03-04 DIAGNOSIS — D1801 Hemangioma of skin and subcutaneous tissue: Secondary | ICD-10-CM | POA: Diagnosis not present

## 2019-03-04 DIAGNOSIS — Z85828 Personal history of other malignant neoplasm of skin: Secondary | ICD-10-CM | POA: Diagnosis not present

## 2019-03-04 DIAGNOSIS — Z08 Encounter for follow-up examination after completed treatment for malignant neoplasm: Secondary | ICD-10-CM | POA: Diagnosis not present

## 2019-03-04 DIAGNOSIS — L821 Other seborrheic keratosis: Secondary | ICD-10-CM | POA: Diagnosis not present

## 2019-03-04 DIAGNOSIS — L570 Actinic keratosis: Secondary | ICD-10-CM | POA: Diagnosis not present

## 2019-03-06 DIAGNOSIS — E7800 Pure hypercholesterolemia, unspecified: Secondary | ICD-10-CM | POA: Diagnosis not present

## 2019-03-06 DIAGNOSIS — I10 Essential (primary) hypertension: Secondary | ICD-10-CM | POA: Diagnosis not present

## 2019-03-06 DIAGNOSIS — E222 Syndrome of inappropriate secretion of antidiuretic hormone: Secondary | ICD-10-CM | POA: Diagnosis not present

## 2019-03-06 DIAGNOSIS — R42 Dizziness and giddiness: Secondary | ICD-10-CM | POA: Diagnosis not present

## 2019-03-07 DIAGNOSIS — C541 Malignant neoplasm of endometrium: Secondary | ICD-10-CM | POA: Diagnosis not present

## 2019-03-08 DIAGNOSIS — E222 Syndrome of inappropriate secretion of antidiuretic hormone: Secondary | ICD-10-CM | POA: Diagnosis not present

## 2019-03-08 DIAGNOSIS — E7800 Pure hypercholesterolemia, unspecified: Secondary | ICD-10-CM | POA: Diagnosis not present

## 2019-03-08 DIAGNOSIS — R42 Dizziness and giddiness: Secondary | ICD-10-CM | POA: Diagnosis not present

## 2019-03-08 DIAGNOSIS — E871 Hypo-osmolality and hyponatremia: Secondary | ICD-10-CM | POA: Diagnosis not present

## 2019-03-08 DIAGNOSIS — R634 Abnormal weight loss: Secondary | ICD-10-CM | POA: Diagnosis not present

## 2019-03-08 DIAGNOSIS — I10 Essential (primary) hypertension: Secondary | ICD-10-CM | POA: Diagnosis not present

## 2019-03-28 DIAGNOSIS — E871 Hypo-osmolality and hyponatremia: Secondary | ICD-10-CM | POA: Diagnosis not present

## 2019-04-04 DIAGNOSIS — E871 Hypo-osmolality and hyponatremia: Secondary | ICD-10-CM | POA: Diagnosis not present

## 2019-04-04 DIAGNOSIS — R42 Dizziness and giddiness: Secondary | ICD-10-CM | POA: Diagnosis not present

## 2019-04-04 DIAGNOSIS — E222 Syndrome of inappropriate secretion of antidiuretic hormone: Secondary | ICD-10-CM | POA: Diagnosis not present

## 2019-04-15 DIAGNOSIS — G9001 Carotid sinus syncope: Secondary | ICD-10-CM | POA: Diagnosis not present

## 2019-04-15 DIAGNOSIS — M542 Cervicalgia: Secondary | ICD-10-CM | POA: Diagnosis not present

## 2019-04-15 DIAGNOSIS — I10 Essential (primary) hypertension: Secondary | ICD-10-CM | POA: Diagnosis not present

## 2019-04-16 DIAGNOSIS — R221 Localized swelling, mass and lump, neck: Secondary | ICD-10-CM | POA: Diagnosis not present

## 2019-04-22 DIAGNOSIS — H81399 Other peripheral vertigo, unspecified ear: Secondary | ICD-10-CM | POA: Diagnosis not present

## 2019-04-24 DIAGNOSIS — H81399 Other peripheral vertigo, unspecified ear: Secondary | ICD-10-CM | POA: Diagnosis not present

## 2019-04-30 DIAGNOSIS — H81399 Other peripheral vertigo, unspecified ear: Secondary | ICD-10-CM | POA: Diagnosis not present

## 2019-05-02 DIAGNOSIS — H81399 Other peripheral vertigo, unspecified ear: Secondary | ICD-10-CM | POA: Diagnosis not present

## 2019-05-06 DIAGNOSIS — H81399 Other peripheral vertigo, unspecified ear: Secondary | ICD-10-CM | POA: Diagnosis not present

## 2019-05-06 DIAGNOSIS — M542 Cervicalgia: Secondary | ICD-10-CM | POA: Diagnosis not present

## 2019-05-07 DIAGNOSIS — C541 Malignant neoplasm of endometrium: Secondary | ICD-10-CM | POA: Diagnosis not present

## 2019-05-07 DIAGNOSIS — N7689 Other specified inflammation of vagina and vulva: Secondary | ICD-10-CM | POA: Diagnosis not present

## 2019-05-07 DIAGNOSIS — Z9889 Other specified postprocedural states: Secondary | ICD-10-CM | POA: Diagnosis not present

## 2019-05-09 DIAGNOSIS — H81399 Other peripheral vertigo, unspecified ear: Secondary | ICD-10-CM | POA: Diagnosis not present

## 2019-05-13 DIAGNOSIS — H81399 Other peripheral vertigo, unspecified ear: Secondary | ICD-10-CM | POA: Diagnosis not present

## 2019-05-14 DIAGNOSIS — R42 Dizziness and giddiness: Secondary | ICD-10-CM | POA: Diagnosis not present

## 2019-05-14 DIAGNOSIS — M542 Cervicalgia: Secondary | ICD-10-CM | POA: Diagnosis not present

## 2019-05-15 DIAGNOSIS — H81399 Other peripheral vertigo, unspecified ear: Secondary | ICD-10-CM | POA: Diagnosis not present

## 2019-05-16 DIAGNOSIS — H81399 Other peripheral vertigo, unspecified ear: Secondary | ICD-10-CM | POA: Diagnosis not present

## 2019-05-21 DIAGNOSIS — H81399 Other peripheral vertigo, unspecified ear: Secondary | ICD-10-CM | POA: Diagnosis not present

## 2019-05-23 DIAGNOSIS — H81399 Other peripheral vertigo, unspecified ear: Secondary | ICD-10-CM | POA: Diagnosis not present

## 2019-05-24 DIAGNOSIS — H81399 Other peripheral vertigo, unspecified ear: Secondary | ICD-10-CM | POA: Diagnosis not present

## 2019-05-28 DIAGNOSIS — H81399 Other peripheral vertigo, unspecified ear: Secondary | ICD-10-CM | POA: Diagnosis not present

## 2019-05-30 DIAGNOSIS — H81399 Other peripheral vertigo, unspecified ear: Secondary | ICD-10-CM | POA: Diagnosis not present

## 2019-05-31 DIAGNOSIS — Z1231 Encounter for screening mammogram for malignant neoplasm of breast: Secondary | ICD-10-CM | POA: Diagnosis not present

## 2019-06-04 DIAGNOSIS — H81399 Other peripheral vertigo, unspecified ear: Secondary | ICD-10-CM | POA: Diagnosis not present

## 2019-06-06 DIAGNOSIS — M545 Low back pain: Secondary | ICD-10-CM | POA: Diagnosis not present

## 2019-06-06 DIAGNOSIS — H81399 Other peripheral vertigo, unspecified ear: Secondary | ICD-10-CM | POA: Diagnosis not present

## 2019-06-12 DIAGNOSIS — M545 Low back pain: Secondary | ICD-10-CM | POA: Diagnosis not present

## 2019-06-12 DIAGNOSIS — H81399 Other peripheral vertigo, unspecified ear: Secondary | ICD-10-CM | POA: Diagnosis not present

## 2019-06-18 DIAGNOSIS — C541 Malignant neoplasm of endometrium: Secondary | ICD-10-CM | POA: Diagnosis not present

## 2019-06-18 DIAGNOSIS — Z79899 Other long term (current) drug therapy: Secondary | ICD-10-CM | POA: Diagnosis not present

## 2019-06-21 DIAGNOSIS — M25552 Pain in left hip: Secondary | ICD-10-CM | POA: Diagnosis not present

## 2019-07-04 DIAGNOSIS — Z23 Encounter for immunization: Secondary | ICD-10-CM | POA: Diagnosis not present

## 2019-07-05 DIAGNOSIS — M25552 Pain in left hip: Secondary | ICD-10-CM | POA: Diagnosis not present

## 2019-07-19 DIAGNOSIS — M25552 Pain in left hip: Secondary | ICD-10-CM | POA: Diagnosis not present

## 2019-07-26 DIAGNOSIS — M25552 Pain in left hip: Secondary | ICD-10-CM | POA: Diagnosis not present

## 2019-07-31 DIAGNOSIS — M545 Low back pain: Secondary | ICD-10-CM | POA: Diagnosis not present

## 2019-07-31 DIAGNOSIS — M4848XA Fatigue fracture of vertebra, sacral and sacrococcygeal region, initial encounter for fracture: Secondary | ICD-10-CM | POA: Diagnosis not present

## 2019-07-31 DIAGNOSIS — M47817 Spondylosis without myelopathy or radiculopathy, lumbosacral region: Secondary | ICD-10-CM | POA: Diagnosis not present

## 2019-07-31 DIAGNOSIS — M5186 Other intervertebral disc disorders, lumbar region: Secondary | ICD-10-CM | POA: Diagnosis not present

## 2019-07-31 DIAGNOSIS — M47816 Spondylosis without myelopathy or radiculopathy, lumbar region: Secondary | ICD-10-CM | POA: Diagnosis not present

## 2019-08-02 DIAGNOSIS — M545 Low back pain: Secondary | ICD-10-CM | POA: Diagnosis not present

## 2019-08-07 DIAGNOSIS — M8448XG Pathological fracture, other site, subsequent encounter for fracture with delayed healing: Secondary | ICD-10-CM | POA: Diagnosis not present

## 2019-08-07 DIAGNOSIS — C541 Malignant neoplasm of endometrium: Secondary | ICD-10-CM | POA: Diagnosis not present

## 2019-08-07 DIAGNOSIS — M25552 Pain in left hip: Secondary | ICD-10-CM | POA: Diagnosis not present

## 2019-08-19 DIAGNOSIS — S3210XA Unspecified fracture of sacrum, initial encounter for closed fracture: Secondary | ICD-10-CM | POA: Diagnosis not present

## 2019-08-19 DIAGNOSIS — S32501A Unspecified fracture of right pubis, initial encounter for closed fracture: Secondary | ICD-10-CM | POA: Diagnosis not present

## 2019-08-19 DIAGNOSIS — M8438XA Stress fracture, other site, initial encounter for fracture: Secondary | ICD-10-CM | POA: Diagnosis not present

## 2019-08-31 DIAGNOSIS — Z20828 Contact with and (suspected) exposure to other viral communicable diseases: Secondary | ICD-10-CM | POA: Diagnosis not present

## 2019-09-18 DIAGNOSIS — I10 Essential (primary) hypertension: Secondary | ICD-10-CM | POA: Diagnosis not present

## 2019-09-18 DIAGNOSIS — E222 Syndrome of inappropriate secretion of antidiuretic hormone: Secondary | ICD-10-CM | POA: Diagnosis not present

## 2019-09-18 DIAGNOSIS — E7800 Pure hypercholesterolemia, unspecified: Secondary | ICD-10-CM | POA: Diagnosis not present

## 2019-09-18 DIAGNOSIS — J309 Allergic rhinitis, unspecified: Secondary | ICD-10-CM | POA: Diagnosis not present

## 2019-09-18 DIAGNOSIS — Z0000 Encounter for general adult medical examination without abnormal findings: Secondary | ICD-10-CM | POA: Diagnosis not present

## 2019-09-24 DIAGNOSIS — R634 Abnormal weight loss: Secondary | ICD-10-CM | POA: Diagnosis not present

## 2019-09-24 DIAGNOSIS — E7800 Pure hypercholesterolemia, unspecified: Secondary | ICD-10-CM | POA: Diagnosis not present

## 2019-09-24 DIAGNOSIS — Z87440 Personal history of urinary (tract) infections: Secondary | ICD-10-CM | POA: Diagnosis not present

## 2019-09-24 DIAGNOSIS — E222 Syndrome of inappropriate secretion of antidiuretic hormone: Secondary | ICD-10-CM | POA: Diagnosis not present

## 2019-09-24 DIAGNOSIS — E871 Hypo-osmolality and hyponatremia: Secondary | ICD-10-CM | POA: Diagnosis not present

## 2019-09-24 DIAGNOSIS — R5383 Other fatigue: Secondary | ICD-10-CM | POA: Diagnosis not present

## 2019-09-24 DIAGNOSIS — M810 Age-related osteoporosis without current pathological fracture: Secondary | ICD-10-CM | POA: Diagnosis not present

## 2019-09-24 DIAGNOSIS — I10 Essential (primary) hypertension: Secondary | ICD-10-CM | POA: Diagnosis not present

## 2019-09-24 DIAGNOSIS — M899 Disorder of bone, unspecified: Secondary | ICD-10-CM | POA: Diagnosis not present

## 2019-10-01 DIAGNOSIS — R202 Paresthesia of skin: Secondary | ICD-10-CM | POA: Diagnosis not present

## 2019-10-01 DIAGNOSIS — M170 Bilateral primary osteoarthritis of knee: Secondary | ICD-10-CM | POA: Diagnosis not present

## 2019-10-03 DIAGNOSIS — S3210XD Unspecified fracture of sacrum, subsequent encounter for fracture with routine healing: Secondary | ICD-10-CM | POA: Diagnosis not present

## 2019-10-03 DIAGNOSIS — C541 Malignant neoplasm of endometrium: Secondary | ICD-10-CM | POA: Diagnosis not present

## 2019-10-03 DIAGNOSIS — Z79899 Other long term (current) drug therapy: Secondary | ICD-10-CM | POA: Diagnosis not present

## 2019-10-03 DIAGNOSIS — Z8542 Personal history of malignant neoplasm of other parts of uterus: Secondary | ICD-10-CM | POA: Diagnosis not present

## 2019-10-03 DIAGNOSIS — Z08 Encounter for follow-up examination after completed treatment for malignant neoplasm: Secondary | ICD-10-CM | POA: Diagnosis not present

## 2019-10-11 DIAGNOSIS — R600 Localized edema: Secondary | ICD-10-CM | POA: Diagnosis not present

## 2019-10-11 DIAGNOSIS — M5417 Radiculopathy, lumbosacral region: Secondary | ICD-10-CM | POA: Diagnosis not present

## 2019-10-11 DIAGNOSIS — E222 Syndrome of inappropriate secretion of antidiuretic hormone: Secondary | ICD-10-CM | POA: Diagnosis not present

## 2019-10-11 DIAGNOSIS — M79605 Pain in left leg: Secondary | ICD-10-CM | POA: Diagnosis not present

## 2019-10-16 DIAGNOSIS — I10 Essential (primary) hypertension: Secondary | ICD-10-CM | POA: Diagnosis not present

## 2019-10-16 DIAGNOSIS — Z8542 Personal history of malignant neoplasm of other parts of uterus: Secondary | ICD-10-CM | POA: Diagnosis not present

## 2019-10-16 DIAGNOSIS — C541 Malignant neoplasm of endometrium: Secondary | ICD-10-CM | POA: Diagnosis not present

## 2019-10-16 DIAGNOSIS — Z08 Encounter for follow-up examination after completed treatment for malignant neoplasm: Secondary | ICD-10-CM | POA: Diagnosis not present

## 2019-10-16 DIAGNOSIS — K219 Gastro-esophageal reflux disease without esophagitis: Secondary | ICD-10-CM | POA: Diagnosis not present

## 2019-10-16 DIAGNOSIS — E785 Hyperlipidemia, unspecified: Secondary | ICD-10-CM | POA: Diagnosis not present

## 2019-10-16 DIAGNOSIS — Z79899 Other long term (current) drug therapy: Secondary | ICD-10-CM | POA: Diagnosis not present

## 2019-10-18 DIAGNOSIS — R600 Localized edema: Secondary | ICD-10-CM | POA: Diagnosis not present

## 2019-10-18 DIAGNOSIS — M79605 Pain in left leg: Secondary | ICD-10-CM | POA: Diagnosis not present

## 2019-12-11 DIAGNOSIS — E871 Hypo-osmolality and hyponatremia: Secondary | ICD-10-CM | POA: Diagnosis not present

## 2019-12-19 DIAGNOSIS — I10 Essential (primary) hypertension: Secondary | ICD-10-CM | POA: Diagnosis not present

## 2019-12-19 DIAGNOSIS — E871 Hypo-osmolality and hyponatremia: Secondary | ICD-10-CM | POA: Diagnosis not present

## 2019-12-19 DIAGNOSIS — E875 Hyperkalemia: Secondary | ICD-10-CM | POA: Diagnosis not present

## 2020-01-15 DIAGNOSIS — M810 Age-related osteoporosis without current pathological fracture: Secondary | ICD-10-CM | POA: Diagnosis not present

## 2020-01-15 DIAGNOSIS — Z780 Asymptomatic menopausal state: Secondary | ICD-10-CM | POA: Diagnosis not present

## 2020-01-15 DIAGNOSIS — Z13820 Encounter for screening for osteoporosis: Secondary | ICD-10-CM | POA: Diagnosis not present

## 2020-02-06 DIAGNOSIS — Z8541 Personal history of malignant neoplasm of cervix uteri: Secondary | ICD-10-CM | POA: Diagnosis not present

## 2020-02-06 DIAGNOSIS — C541 Malignant neoplasm of endometrium: Secondary | ICD-10-CM | POA: Diagnosis not present

## 2020-02-06 DIAGNOSIS — Z08 Encounter for follow-up examination after completed treatment for malignant neoplasm: Secondary | ICD-10-CM | POA: Diagnosis not present

## 2020-02-06 DIAGNOSIS — C7982 Secondary malignant neoplasm of genital organs: Secondary | ICD-10-CM | POA: Diagnosis not present

## 2020-02-11 DIAGNOSIS — R8290 Unspecified abnormal findings in urine: Secondary | ICD-10-CM | POA: Diagnosis not present

## 2020-02-11 DIAGNOSIS — R300 Dysuria: Secondary | ICD-10-CM | POA: Diagnosis not present

## 2020-02-11 DIAGNOSIS — R3915 Urgency of urination: Secondary | ICD-10-CM | POA: Diagnosis not present

## 2020-02-11 DIAGNOSIS — R350 Frequency of micturition: Secondary | ICD-10-CM | POA: Diagnosis not present

## 2020-02-18 DIAGNOSIS — I10 Essential (primary) hypertension: Secondary | ICD-10-CM | POA: Diagnosis not present

## 2020-02-18 DIAGNOSIS — E7800 Pure hypercholesterolemia, unspecified: Secondary | ICD-10-CM | POA: Diagnosis not present

## 2020-02-18 DIAGNOSIS — H5213 Myopia, bilateral: Secondary | ICD-10-CM | POA: Diagnosis not present

## 2020-02-24 DIAGNOSIS — Z9889 Other specified postprocedural states: Secondary | ICD-10-CM | POA: Diagnosis not present

## 2020-02-24 DIAGNOSIS — M5417 Radiculopathy, lumbosacral region: Secondary | ICD-10-CM | POA: Diagnosis not present

## 2020-02-24 DIAGNOSIS — Z96641 Presence of right artificial hip joint: Secondary | ICD-10-CM | POA: Diagnosis not present

## 2020-02-24 DIAGNOSIS — M810 Age-related osteoporosis without current pathological fracture: Secondary | ICD-10-CM | POA: Diagnosis not present

## 2020-02-24 DIAGNOSIS — M79605 Pain in left leg: Secondary | ICD-10-CM | POA: Diagnosis not present

## 2020-02-24 DIAGNOSIS — R278 Other lack of coordination: Secondary | ICD-10-CM | POA: Diagnosis not present

## 2020-02-24 DIAGNOSIS — M6289 Other specified disorders of muscle: Secondary | ICD-10-CM | POA: Diagnosis not present

## 2020-02-24 DIAGNOSIS — Z8542 Personal history of malignant neoplasm of other parts of uterus: Secondary | ICD-10-CM | POA: Diagnosis not present

## 2020-02-26 DIAGNOSIS — Z08 Encounter for follow-up examination after completed treatment for malignant neoplasm: Secondary | ICD-10-CM | POA: Diagnosis not present

## 2020-02-26 DIAGNOSIS — C541 Malignant neoplasm of endometrium: Secondary | ICD-10-CM | POA: Diagnosis not present

## 2020-02-26 DIAGNOSIS — Z8542 Personal history of malignant neoplasm of other parts of uterus: Secondary | ICD-10-CM | POA: Diagnosis not present

## 2020-03-02 DIAGNOSIS — M6289 Other specified disorders of muscle: Secondary | ICD-10-CM | POA: Diagnosis not present

## 2020-03-02 DIAGNOSIS — M5417 Radiculopathy, lumbosacral region: Secondary | ICD-10-CM | POA: Diagnosis not present

## 2020-03-02 DIAGNOSIS — R278 Other lack of coordination: Secondary | ICD-10-CM | POA: Diagnosis not present

## 2020-03-02 DIAGNOSIS — Z8542 Personal history of malignant neoplasm of other parts of uterus: Secondary | ICD-10-CM | POA: Diagnosis not present

## 2020-03-02 DIAGNOSIS — Z9889 Other specified postprocedural states: Secondary | ICD-10-CM | POA: Diagnosis not present

## 2020-03-02 DIAGNOSIS — Z96641 Presence of right artificial hip joint: Secondary | ICD-10-CM | POA: Diagnosis not present

## 2020-03-02 DIAGNOSIS — M79605 Pain in left leg: Secondary | ICD-10-CM | POA: Diagnosis not present

## 2020-03-02 DIAGNOSIS — M810 Age-related osteoporosis without current pathological fracture: Secondary | ICD-10-CM | POA: Diagnosis not present

## 2020-03-05 DIAGNOSIS — M79605 Pain in left leg: Secondary | ICD-10-CM | POA: Diagnosis not present

## 2020-03-05 DIAGNOSIS — M5417 Radiculopathy, lumbosacral region: Secondary | ICD-10-CM | POA: Diagnosis not present

## 2020-03-05 DIAGNOSIS — M810 Age-related osteoporosis without current pathological fracture: Secondary | ICD-10-CM | POA: Diagnosis not present

## 2020-03-05 DIAGNOSIS — M6289 Other specified disorders of muscle: Secondary | ICD-10-CM | POA: Diagnosis not present

## 2020-03-05 DIAGNOSIS — Z96641 Presence of right artificial hip joint: Secondary | ICD-10-CM | POA: Diagnosis not present

## 2020-03-05 DIAGNOSIS — R278 Other lack of coordination: Secondary | ICD-10-CM | POA: Diagnosis not present

## 2020-03-05 DIAGNOSIS — Z8542 Personal history of malignant neoplasm of other parts of uterus: Secondary | ICD-10-CM | POA: Diagnosis not present

## 2020-03-05 DIAGNOSIS — Z9889 Other specified postprocedural states: Secondary | ICD-10-CM | POA: Diagnosis not present

## 2020-03-09 DIAGNOSIS — R278 Other lack of coordination: Secondary | ICD-10-CM | POA: Diagnosis not present

## 2020-03-09 DIAGNOSIS — Z96641 Presence of right artificial hip joint: Secondary | ICD-10-CM | POA: Diagnosis not present

## 2020-03-09 DIAGNOSIS — D1801 Hemangioma of skin and subcutaneous tissue: Secondary | ICD-10-CM | POA: Diagnosis not present

## 2020-03-09 DIAGNOSIS — M5417 Radiculopathy, lumbosacral region: Secondary | ICD-10-CM | POA: Diagnosis not present

## 2020-03-09 DIAGNOSIS — M810 Age-related osteoporosis without current pathological fracture: Secondary | ICD-10-CM | POA: Diagnosis not present

## 2020-03-09 DIAGNOSIS — Z9889 Other specified postprocedural states: Secondary | ICD-10-CM | POA: Diagnosis not present

## 2020-03-09 DIAGNOSIS — M71341 Other bursal cyst, right hand: Secondary | ICD-10-CM | POA: Diagnosis not present

## 2020-03-09 DIAGNOSIS — Z8542 Personal history of malignant neoplasm of other parts of uterus: Secondary | ICD-10-CM | POA: Diagnosis not present

## 2020-03-09 DIAGNOSIS — M79605 Pain in left leg: Secondary | ICD-10-CM | POA: Diagnosis not present

## 2020-03-09 DIAGNOSIS — L570 Actinic keratosis: Secondary | ICD-10-CM | POA: Diagnosis not present

## 2020-03-09 DIAGNOSIS — L821 Other seborrheic keratosis: Secondary | ICD-10-CM | POA: Diagnosis not present

## 2020-03-09 DIAGNOSIS — Z85828 Personal history of other malignant neoplasm of skin: Secondary | ICD-10-CM | POA: Diagnosis not present

## 2020-03-09 DIAGNOSIS — M6289 Other specified disorders of muscle: Secondary | ICD-10-CM | POA: Diagnosis not present

## 2020-03-11 DIAGNOSIS — M170 Bilateral primary osteoarthritis of knee: Secondary | ICD-10-CM | POA: Diagnosis not present

## 2020-03-12 DIAGNOSIS — M5417 Radiculopathy, lumbosacral region: Secondary | ICD-10-CM | POA: Diagnosis not present

## 2020-03-12 DIAGNOSIS — M810 Age-related osteoporosis without current pathological fracture: Secondary | ICD-10-CM | POA: Diagnosis not present

## 2020-03-12 DIAGNOSIS — Z8542 Personal history of malignant neoplasm of other parts of uterus: Secondary | ICD-10-CM | POA: Diagnosis not present

## 2020-03-12 DIAGNOSIS — Z9889 Other specified postprocedural states: Secondary | ICD-10-CM | POA: Diagnosis not present

## 2020-03-12 DIAGNOSIS — M79605 Pain in left leg: Secondary | ICD-10-CM | POA: Diagnosis not present

## 2020-03-12 DIAGNOSIS — Z96641 Presence of right artificial hip joint: Secondary | ICD-10-CM | POA: Diagnosis not present

## 2020-03-12 DIAGNOSIS — R278 Other lack of coordination: Secondary | ICD-10-CM | POA: Diagnosis not present

## 2020-03-12 DIAGNOSIS — M6289 Other specified disorders of muscle: Secondary | ICD-10-CM | POA: Diagnosis not present

## 2020-03-16 DIAGNOSIS — Z96641 Presence of right artificial hip joint: Secondary | ICD-10-CM | POA: Diagnosis not present

## 2020-03-16 DIAGNOSIS — R278 Other lack of coordination: Secondary | ICD-10-CM | POA: Diagnosis not present

## 2020-03-16 DIAGNOSIS — M5417 Radiculopathy, lumbosacral region: Secondary | ICD-10-CM | POA: Diagnosis not present

## 2020-03-16 DIAGNOSIS — Z8542 Personal history of malignant neoplasm of other parts of uterus: Secondary | ICD-10-CM | POA: Diagnosis not present

## 2020-03-16 DIAGNOSIS — Z9889 Other specified postprocedural states: Secondary | ICD-10-CM | POA: Diagnosis not present

## 2020-03-16 DIAGNOSIS — M810 Age-related osteoporosis without current pathological fracture: Secondary | ICD-10-CM | POA: Diagnosis not present

## 2020-03-16 DIAGNOSIS — M6289 Other specified disorders of muscle: Secondary | ICD-10-CM | POA: Diagnosis not present

## 2020-03-16 DIAGNOSIS — M79605 Pain in left leg: Secondary | ICD-10-CM | POA: Diagnosis not present

## 2020-03-18 DIAGNOSIS — E7800 Pure hypercholesterolemia, unspecified: Secondary | ICD-10-CM | POA: Diagnosis not present

## 2020-03-18 DIAGNOSIS — I10 Essential (primary) hypertension: Secondary | ICD-10-CM | POA: Diagnosis not present

## 2020-03-18 DIAGNOSIS — M810 Age-related osteoporosis without current pathological fracture: Secondary | ICD-10-CM | POA: Diagnosis not present

## 2020-03-18 DIAGNOSIS — E222 Syndrome of inappropriate secretion of antidiuretic hormone: Secondary | ICD-10-CM | POA: Diagnosis not present

## 2020-03-19 DIAGNOSIS — R278 Other lack of coordination: Secondary | ICD-10-CM | POA: Diagnosis not present

## 2020-03-19 DIAGNOSIS — M5417 Radiculopathy, lumbosacral region: Secondary | ICD-10-CM | POA: Diagnosis not present

## 2020-03-19 DIAGNOSIS — M79605 Pain in left leg: Secondary | ICD-10-CM | POA: Diagnosis not present

## 2020-03-24 DIAGNOSIS — E871 Hypo-osmolality and hyponatremia: Secondary | ICD-10-CM | POA: Diagnosis not present

## 2020-03-24 DIAGNOSIS — E7800 Pure hypercholesterolemia, unspecified: Secondary | ICD-10-CM | POA: Diagnosis not present

## 2020-03-24 DIAGNOSIS — E222 Syndrome of inappropriate secretion of antidiuretic hormone: Secondary | ICD-10-CM | POA: Diagnosis not present

## 2020-03-24 DIAGNOSIS — M79605 Pain in left leg: Secondary | ICD-10-CM | POA: Diagnosis not present

## 2020-03-24 DIAGNOSIS — R278 Other lack of coordination: Secondary | ICD-10-CM | POA: Diagnosis not present

## 2020-03-24 DIAGNOSIS — I10 Essential (primary) hypertension: Secondary | ICD-10-CM | POA: Diagnosis not present

## 2020-03-24 DIAGNOSIS — M5417 Radiculopathy, lumbosacral region: Secondary | ICD-10-CM | POA: Diagnosis not present

## 2020-03-30 DIAGNOSIS — M79605 Pain in left leg: Secondary | ICD-10-CM | POA: Diagnosis not present

## 2020-03-30 DIAGNOSIS — M5417 Radiculopathy, lumbosacral region: Secondary | ICD-10-CM | POA: Diagnosis not present

## 2020-03-30 DIAGNOSIS — R278 Other lack of coordination: Secondary | ICD-10-CM | POA: Diagnosis not present

## 2020-04-02 DIAGNOSIS — R278 Other lack of coordination: Secondary | ICD-10-CM | POA: Diagnosis not present

## 2020-04-02 DIAGNOSIS — M5417 Radiculopathy, lumbosacral region: Secondary | ICD-10-CM | POA: Diagnosis not present

## 2020-04-02 DIAGNOSIS — M79605 Pain in left leg: Secondary | ICD-10-CM | POA: Diagnosis not present

## 2020-04-06 DIAGNOSIS — M79605 Pain in left leg: Secondary | ICD-10-CM | POA: Diagnosis not present

## 2020-04-06 DIAGNOSIS — R278 Other lack of coordination: Secondary | ICD-10-CM | POA: Diagnosis not present

## 2020-04-06 DIAGNOSIS — M5417 Radiculopathy, lumbosacral region: Secondary | ICD-10-CM | POA: Diagnosis not present

## 2020-04-09 DIAGNOSIS — M79605 Pain in left leg: Secondary | ICD-10-CM | POA: Diagnosis not present

## 2020-04-09 DIAGNOSIS — R278 Other lack of coordination: Secondary | ICD-10-CM | POA: Diagnosis not present

## 2020-04-09 DIAGNOSIS — M5417 Radiculopathy, lumbosacral region: Secondary | ICD-10-CM | POA: Diagnosis not present

## 2020-04-14 DIAGNOSIS — M5417 Radiculopathy, lumbosacral region: Secondary | ICD-10-CM | POA: Diagnosis not present

## 2020-04-14 DIAGNOSIS — M5418 Radiculopathy, sacral and sacrococcygeal region: Secondary | ICD-10-CM | POA: Diagnosis not present

## 2020-04-14 DIAGNOSIS — R278 Other lack of coordination: Secondary | ICD-10-CM | POA: Diagnosis not present

## 2020-04-14 DIAGNOSIS — R42 Dizziness and giddiness: Secondary | ICD-10-CM | POA: Diagnosis not present

## 2020-04-14 DIAGNOSIS — M79605 Pain in left leg: Secondary | ICD-10-CM | POA: Diagnosis not present

## 2020-04-14 DIAGNOSIS — E222 Syndrome of inappropriate secretion of antidiuretic hormone: Secondary | ICD-10-CM | POA: Diagnosis not present

## 2020-04-16 DIAGNOSIS — M5417 Radiculopathy, lumbosacral region: Secondary | ICD-10-CM | POA: Diagnosis not present

## 2020-04-16 DIAGNOSIS — R278 Other lack of coordination: Secondary | ICD-10-CM | POA: Diagnosis not present

## 2020-04-16 DIAGNOSIS — M79605 Pain in left leg: Secondary | ICD-10-CM | POA: Diagnosis not present

## 2020-04-21 DIAGNOSIS — R278 Other lack of coordination: Secondary | ICD-10-CM | POA: Diagnosis not present

## 2020-04-27 DIAGNOSIS — R278 Other lack of coordination: Secondary | ICD-10-CM | POA: Diagnosis not present

## 2020-04-30 DIAGNOSIS — R278 Other lack of coordination: Secondary | ICD-10-CM | POA: Diagnosis not present

## 2020-05-07 DIAGNOSIS — R278 Other lack of coordination: Secondary | ICD-10-CM | POA: Diagnosis not present

## 2020-05-11 DIAGNOSIS — R278 Other lack of coordination: Secondary | ICD-10-CM | POA: Diagnosis not present

## 2020-05-14 DIAGNOSIS — R278 Other lack of coordination: Secondary | ICD-10-CM | POA: Diagnosis not present

## 2020-05-19 DIAGNOSIS — R278 Other lack of coordination: Secondary | ICD-10-CM | POA: Diagnosis not present

## 2021-01-15 ENCOUNTER — Encounter: Payer: Self-pay | Admitting: Internal Medicine

## 2021-03-10 ENCOUNTER — Ambulatory Visit: Payer: Medicare HMO | Admitting: Internal Medicine

## 2021-03-10 ENCOUNTER — Encounter: Payer: Self-pay | Admitting: Internal Medicine

## 2021-03-10 VITALS — BP 168/78 | HR 70 | Ht <= 58 in | Wt 122.0 lb

## 2021-03-10 DIAGNOSIS — Z8719 Personal history of other diseases of the digestive system: Secondary | ICD-10-CM | POA: Diagnosis not present

## 2021-03-10 DIAGNOSIS — R194 Change in bowel habit: Secondary | ICD-10-CM

## 2021-03-10 DIAGNOSIS — R1310 Dysphagia, unspecified: Secondary | ICD-10-CM

## 2021-03-10 DIAGNOSIS — K219 Gastro-esophageal reflux disease without esophagitis: Secondary | ICD-10-CM

## 2021-03-10 NOTE — Patient Instructions (Signed)
If you are age 83 or older, your body mass index should be between 23-30. Your Body mass index is 25.5 kg/m. If this is out of the aforementioned range listed, please consider follow up with your Primary Care Provider.  If you are age 15 or younger, your body mass index should be between 19-25. Your Body mass index is 25.5 kg/m. If this is out of the aformentioned range listed, please consider follow up with your Primary Care Provider.   __________________________________________________________  The [facility] providers would like to encourage you to use [facility] to communicate with providers for non-urgent requests or questions.  Due to long hold times on the telephone, sending your provider a message by [facility] may be a faster and more efficient way to get a response.  Please allow 48 business hours for a response.  Please remember that this is for non-urgent requests.   You have been scheduled for an endoscopy. Please follow written instructions given to you at your visit today. If you use inhalers (even only as needed), please bring them with you on the day of your procedure.  Due to recent changes in healthcare laws, you may see the results of your imaging and laboratory studies on MyChart before your provider has had a chance to review them.  We understand that in some cases there may be results that are confusing or concerning to you. Not all laboratory results come back in the same time frame and the provider may be waiting for multiple results in order to interpret others.  Please give [place] 48 hours in order for your provider to thoroughly review all the results before contacting the office for clarification of your results.    Thank you for choosing me and [facility].  Dr . Scarlette Shorts

## 2021-03-10 NOTE — Progress Notes (Signed)
HISTORY OF PRESENT ILLNESS:  Destiny Bruce is a 85 y.o. female with a history of uterine cancer, hypertension, hyperlipidemia, and GERD complicated by peptic stricture.  She presents today regarding worsening intermittent solid food dysphagia.  She also mentions change in her stool caliber since undergoing hysterectomy and radiation therapy for her gynecologic cancer.  For her reflux symptoms she takes Prevacid 15 mg daily.  This controls symptoms.  She last underwent upper endoscopy with esophageal dilation (54 [place] Maloney dilator) July 2016.  She last underwent complete colonoscopy in June 2015.  The examination was normal except for right-sided AVM.  She also had a normal colonoscopy in 2009.  REVIEW OF SYSTEMS:  All non-GI ROS negative unless otherwise mentioned in the HPI except for back pain, hip pain, sciatica  Past Medical History:  Diagnosis Date   Arthritis    GERD (gastroesophageal reflux disease) 2008   Hiatal hernia 2008   Hx: UTI (urinary tract infection)    Hyperlipemia    Hypertension    Kyphosis    Osteoporosis    Stricture and stenosis of esophagus 2008   Uterine cancer [facility]) 2019   Dr. Freddrick March    Past Surgical History:  Procedure Laterality Date   ABDOMINAL HYSTERECTOMY     2019   BREAST SURGERY     Cyst removed    HIP SURGERY     TUBAL LIGATION      Social History Destiny Bruce  reports that she has never smoked. She has never used smokeless tobacco. She reports that she does not drink alcohol and does not use drugs.  family history includes CAD in her maternal uncle; Cancer in her maternal grandfather and paternal aunt; GER disease in her mother; Heart disease in her sister; Heart failure in her father; Leukemia in her paternal uncle; Lung cancer in her brother; Prostate cancer in her maternal uncle.  Allergies  Allergen Reactions   Macrobid [Nitrofurantoin Monohyd Macro] Nausea And Vomiting and Other (See Comments)    Pt can tolerate  Nitrofurantoin but not the Macrobid. Macrobid makes pt very sick   Codeine Palpitations    Pt states she feels like her heart is going to jump out of her chest       PHYSICAL EXAMINATION: Vital signs: BP (!) 168/78   Pulse 70   Ht 4\' 10"  (1.473 m)   Wt 122 lb (55.3 kg)   SpO2 99%   BMI 25.50 kg/m   Constitutional: Elderly, slow-moving, but generally well-appearing, no acute distress Psychiatric: alert and oriented x3, cooperative Eyes: extraocular movements intact, anicteric, conjunctiva pink Mouth: Mask Neck: supple no lymphadenopathy Cardiovascular: heart regular rate and rhythm, no murmur Lungs: clear to auscultation bilaterally Abdomen: soft, nontender, nondistended, no obvious ascites, no peritoneal signs, normal bowel sounds, no organomegaly Rectal: Omitted Extremities: no clubbing, cyanosis, or lower extremity edema bilaterally Skin: no lesions on visible extremities Neuro: No focal deficits.  Cranial nerves intact  ASSESSMENT:  1.  GERD complicated by peptic stricture.  Classic reflux symptoms seemingly controlled with PPI. 2.  Recurrent intermittent solid food dysphagia.  Suspect recurrent stricture 3.  Nonspecific change in stool caliber without additional alarm features.  Negative colonoscopy 2009 in 2015 4.  History of GYN cancer status surgery and postradiation therapy   PLAN:  1.  Reflux precautions 2.  Continue PPI 3.  Schedule upper endoscopy with esophageal dilation.  The patient is high risk given her age and comorbid conditions.The nature of the procedure, as well as the risks,  benefits, and alternatives were carefully and thoroughly reviewed with the patient. Ample time for discussion and questions allowed. The patient understood, was satisfied, and agreed to proceed.  4.  Increase fiber and water

## 2021-04-29 ENCOUNTER — Encounter: Payer: Self-pay | Admitting: Internal Medicine

## 2021-04-29 ENCOUNTER — Other Ambulatory Visit: Payer: Self-pay

## 2021-04-29 ENCOUNTER — Ambulatory Visit (AMBULATORY_SURGERY_CENTER): Payer: Medicare HMO | Admitting: Internal Medicine

## 2021-04-29 VITALS — BP 152/70 | HR 71 | Temp 98.0°F | Resp 15 | Ht <= 58 in | Wt 122.0 lb

## 2021-04-29 DIAGNOSIS — R1310 Dysphagia, unspecified: Secondary | ICD-10-CM

## 2021-04-29 DIAGNOSIS — K317 Polyp of stomach and duodenum: Secondary | ICD-10-CM | POA: Diagnosis not present

## 2021-04-29 DIAGNOSIS — K219 Gastro-esophageal reflux disease without esophagitis: Secondary | ICD-10-CM | POA: Diagnosis not present

## 2021-04-29 DIAGNOSIS — K222 Esophageal obstruction: Secondary | ICD-10-CM

## 2021-04-29 DIAGNOSIS — Z8719 Personal history of other diseases of the digestive system: Secondary | ICD-10-CM | POA: Diagnosis not present

## 2021-04-29 MED ORDER — SODIUM CHLORIDE 0.9 % IV SOLN: 500.0000 mL | Freq: Once | INTRAVENOUS | Status: DC

## 2021-04-29 NOTE — Progress Notes (Signed)
Called to room to assist during endoscopic procedure.  Patient ID and intended procedure confirmed with present staff. Received instructions for my participation in the procedure from the performing physician.  

## 2021-04-29 NOTE — Progress Notes (Signed)
Report to PACU, RN, vss, BBS= Clear.  

## 2021-04-29 NOTE — Op Note (Signed)
[place] Patient Name: Destiny Bruce Procedure Date: 04/29/2021 9:41 AM MRN: [id number] Endoscopist: Docia Chuck. Henrene Pastor , MD Age: 85 Referring MD:  Date of Birth: 1936/07/17 Gender: Female Account #: [id number] Procedure:                Upper GI endoscopy with biopsies; balloon dilation                            of the esophagus?"20 mm Indications:              Therapeutic procedure, Dysphagia Medicines:                Monitored Anesthesia Care Procedure:                Pre-Anesthesia Assessment:                           - Prior to the procedure, a History and Physical                            was performed, and patient medications and                            allergies were reviewed. The patient's tolerance of                            previous anesthesia was also reviewed. The risks                            and benefits of the procedure and the sedation                            options and risks were discussed with the patient.                            All questions were answered, and informed consent                            was obtained. Prior Anticoagulants: The patient has                            taken no previous anticoagulant or antiplatelet                            agents. ASA Grade Assessment: II - A patient with                            mild systemic disease. After reviewing the risks                            and benefits, the patient was deemed in                            satisfactory condition to undergo the procedure.  After obtaining informed consent, the endoscope was                            passed under direct vision. Throughout the                            procedure, the patient's blood pressure, pulse, and                            oxygen saturations were monitored continuously. The                            GIF [id number] was introduced through the                            mouth, and  advanced to the second part of duodenum.                            The upper GI endoscopy was accomplished without                            difficulty. The patient tolerated the procedure                            well. Scope In: Scope Out: Findings:                 One benign-appearing, intrinsic ringlike moderate                            stenosis was found at the gastroesophageal                            junction. This measured approximately 15 mm in                            diameter. After completing the endoscopic survey,                            TTS dilator was passed through the scope. Dilation                            with an 18-19-20 mm balloon dilator was performed                            to 20 mm.                           The esophagus also revealed mild esophagitis at the                            stricture. No other abnormalities of the esophagus.                           The stomach revealed a large hiatal hernia  and                            small benign fundic gland type polyps. Biopsies                            were taken with a cold forceps for histology.                           The examined duodenum was normal.                           The cardia and gastric fundus were normal on                            retroflexion, save hernia. Complications:            No immediate complications. Estimated Blood Loss:     Estimated blood loss: none. Impression:               1. GERD complicated by peptic stricture status post                            dilation?"20 mm balloon                           2. Large hiatal hernia                           3. Incidental gastric polyps. Recommendation:           - Patient has a contact number available for                            emergencies. The signs and symptoms of potential                            delayed complications were discussed with the                            patient. Return to normal  activities tomorrow.                            Written discharge instructions were provided to the                            patient.                           - Post dilation diet.                           - Continue present medications.                           - Await pathology results. Docia Chuck. Henrene Pastor, MD 04/29/2021 10:19:21 AM This report has been signed electronically.

## 2021-04-29 NOTE — Patient Instructions (Signed)
Impression/Recommendations:  GERD, Hiatal hernia, and post-dilation diet handouts given to patient.  Post dilation diet. Continue present mediations. Await pathology results.  YOU HAD AN ENDOSCOPIC PROCEDURE TODAY AT [facility]:   Refer to the procedure report that was given to you for any specific questions about what was found during the examination.  If the procedure report does not answer your questions, please call your gastroenterologist to clarify.  If you requested that your care partner not be given the details of your procedure findings, then the procedure report has been included in a sealed envelope for you to review at your convenience later.  YOU SHOULD EXPECT: Some feelings of bloating in the abdomen. Passage of more gas than usual.  Walking can help get rid of the air that was put into your GI tract during the procedure and reduce the bloating. If you had a lower endoscopy (such as a colonoscopy or flexible sigmoidoscopy) you may notice spotting of blood in your stool or on the toilet paper. If you underwent a bowel prep for your procedure, you may not have a normal bowel movement for a few days.  Please Note:  You might notice some irritation and congestion in your nose or some drainage.  This is from the oxygen used during your procedure.  There is no need for concern and it should clear up in a day or so.  SYMPTOMS TO REPORT IMMEDIATELY: Following upper endoscopy (EGD)  Vomiting of blood or coffee ground material  New chest pain or pain under the shoulder blades  Painful or persistently difficult swallowing  New shortness of breath  Fever of 100F or higher  Black, tarry-looking stools  For urgent or emergent issues, a gastroenterologist can be reached at any hour by calling [phone]. Do not use MyChart messaging for urgent concerns.    DIET:  We do recommend a small meal at first, but then you may proceed to your regular diet.  Drink plenty of  fluids but you should avoid alcoholic beverages for 24 hours.  ACTIVITY:  You should plan to take it easy for the rest of today and you should NOT DRIVE or use heavy machinery until tomorrow (because of the sedation medicines used during the test).    FOLLOW UP: Our staff will call the number listed on your records 48-72 hours following your procedure to check on you and address any questions or concerns that you may have regarding the information given to you following your procedure. If we do not reach you, we will leave a message.  We will attempt to reach you two times.  During this call, we will ask if you have developed any symptoms of COVID 19. If you develop any symptoms (ie: fever, flu-like symptoms, shortness of breath, cough etc.) before then, please call [phone].  If you test positive for Covid 19 in the 2 weeks post procedure, please call and report this information to [place].    If any biopsies were taken you will be contacted by phone or by letter within the next 1-3 weeks.  Please call us at [phone] if you have not heard about the biopsies in 3 weeks.    SIGNATURES/CONFIDENTIALITY: You and/or your care partner have signed paperwork which will be entered into your electronic medical record.  These signatures attest to the fact that that the information above on your After Visit Summary has been reviewed and is understood.  Full responsibility of the confidentiality of this discharge  information lies with you and/or your care-partner.

## 2021-04-29 NOTE — Progress Notes (Signed)
GI PREPROCEDURE NOTE  The patient presents to the endoscopy suite today for upper endoscopy with esophageal dilation for dysphagia.  She was thoroughly evaluated in the office March 10, 2021 as outlined below.  There have been no interval changes in history or physical examination.  The MD endoscopy charting has been completed.  HISTORY OF PRESENT ILLNESS:   Destiny Bruce is a 85 y.o. female with a history of uterine cancer, hypertension, hyperlipidemia, and GERD complicated by peptic stricture.  She presents today regarding worsening intermittent solid food dysphagia.  She also mentions change in her stool caliber since undergoing hysterectomy and radiation therapy for her gynecologic cancer.  For her reflux symptoms she takes Prevacid 15 mg daily.  This controls symptoms.  She last underwent upper endoscopy with esophageal dilation (54 [place] Maloney dilator) July 2016.  She last underwent complete colonoscopy in June 2015.  The examination was normal except for right-sided AVM.  She also had a normal colonoscopy in 2009.   REVIEW OF SYSTEMS:   All non-GI ROS negative unless otherwise mentioned in the HPI except for back pain, hip pain, sciatica       Past Medical History:  Diagnosis Date   Arthritis     GERD (gastroesophageal reflux disease) 2008   Hiatal hernia 2008   Hx: UTI (urinary tract infection)     Hyperlipemia     Hypertension     Kyphosis     Osteoporosis     Stricture and stenosis of esophagus 2008   Uterine cancer [facility]) 2019    Dr. Freddrick March           Past Surgical History:  Procedure Laterality Date   ABDOMINAL HYSTERECTOMY        2019   BREAST SURGERY        Cyst removed    HIP SURGERY       TUBAL LIGATION          Social History Loral Foor  reports that she has never smoked. She has never used smokeless tobacco. She reports that she does not drink alcohol and does not use drugs.   family history includes CAD in her maternal uncle; Cancer in her  maternal grandfather and paternal aunt; GER disease in her mother; Heart disease in her sister; Heart failure in her father; Leukemia in her paternal uncle; Lung cancer in her brother; Prostate cancer in her maternal uncle.        Allergies  Allergen Reactions   Macrobid [Nitrofurantoin Monohyd Macro] Nausea And Vomiting and Other (See Comments)      Pt can tolerate Nitrofurantoin but not the Macrobid. Macrobid makes pt very sick   Codeine Palpitations      Pt states she feels like her heart is going to jump out of her chest          PHYSICAL EXAMINATION: Vital signs: BP (!) 168/78   Pulse 70   Ht '4\' 10"'$  (1.473 m)   Wt 122 lb (55.3 kg)   SpO2 99%   BMI 25.50 kg/m   Constitutional: Elderly, slow-moving, but generally well-appearing, no acute distress Psychiatric: alert and oriented x3, cooperative Eyes: extraocular movements intact, anicteric, conjunctiva pink Mouth: Mask Neck: supple no lymphadenopathy Cardiovascular: heart regular rate and rhythm, no murmur Lungs: clear to auscultation bilaterally Abdomen: soft, nontender, nondistended, no obvious ascites, no peritoneal signs, normal bowel sounds, no organomegaly Rectal: Omitted Extremities: no clubbing, cyanosis, or lower extremity edema bilaterally Skin: no lesions on visible extremities Neuro: No focal  deficits.  Cranial nerves intact   ASSESSMENT:   1.  GERD complicated by peptic stricture.  Classic reflux symptoms seemingly controlled with PPI. 2.  Recurrent intermittent solid food dysphagia.  Suspect recurrent stricture 3.  Nonspecific change in stool caliber without additional alarm features.  Negative colonoscopy 2009 in 2015 4.  History of GYN cancer status surgery and postradiation therapy     PLAN:   1.  Reflux precautions 2.  Continue PPI 3.  Schedule upper endoscopy with esophageal dilation.  The patient is high risk given her age and comorbid conditions.The nature of the procedure, as well as the risks,  benefits, and alternatives were carefully and thoroughly reviewed with the patient. Ample time for discussion and questions allowed. The patient understood, was satisfied, and agreed to proceed.  4.  Increase fiber and water

## 2021-05-03 ENCOUNTER — Telehealth: Payer: Self-pay

## 2021-05-03 NOTE — Telephone Encounter (Signed)
  Follow up Call-  Call back number 04/29/2021  Post procedure Call Back phone  # [phone] or [phone] cell  Permission to leave phone message Yes  Some recent data might be hidden     Patient questions:  Do you have a fever, pain , or abdominal swelling? No. Pain Score  0 *  Have you tolerated food without any problems? Yes.    Per pt she has a "little bit of throat soreness".  She said she was still eating soft food.  I explained this was common after esophageal dilatation.  To try warm water gargles, suck on cough drops or sore throat loz.  If sx does not resolve to please call Dr. Henrene Pastor back. Pt said she would.  Have you been able to return to your normal activities? Yes.    Do you have any questions about your discharge instructions: Diet   No. Medications  No. Follow up visit  No.  Do you have questions or concerns about your Care? No.  Actions: * If pain score is 4 or above: No action needed, pain <4.   Have you developed a fever since your procedure? no  2.   Have you had an respiratory symptoms (SOB or cough) since your procedure? no  3.   Have you tested positive for COVID 19 since your procedure no  4.   Have you had any family members/close contacts diagnosed with the COVID 19 since your procedure?  no   If yes to any of these questions please route to Joylene John, RN and Joella Prince, RN

## 2021-05-04 ENCOUNTER — Encounter: Payer: Self-pay | Admitting: Internal Medicine

## 2024-01-18 ENCOUNTER — Telehealth: Payer: Self-pay | Admitting: Internal Medicine

## 2024-01-18 NOTE — Telephone Encounter (Signed)
 Inbound call from patient with her care giver Jullie Oiler. Jullie Oiler spoke for patient and stated that she was seen in the ER for feeling like she has something stuck in her throat. Patient was advised to be on a liquid diet and to call our office to get an appointment. Patient called yesterday and as an appointment with [facility] on 5/28. Patient feels she is not going to be able to wait that long due to not being able to eat. Jullie Oiler is requesting a call back to discuss if patient can be seen sooner than later. Please advise.  [phone]

## 2024-01-18 NOTE — Telephone Encounter (Signed)
 Spoke to [place], patient's care giver who states that patient went to the emergency room yesterday following ingesting a sandwich with pickle that felt like it became lodged in her esophagus. Per caregiver, patient has been able to continue tolerating liquids but patient refuses to eat any solid foods due to fear that it will become lodged in her throat again. Caregiver says patient was pretty lethargic last night due to decreased PO intake but again, denies any vomiting or esophageal pain. Burdette Carolin says patient has been having swallowing issues intermittently since at least September 2024. She does have hx of peptic stricture requiring multiple dilations, most recent 2022.   Patient is scheduled to see Brigitte Canard, PA-C on 01/19/24. I have encouraged pushing fluids. Caregiver has also been advised that if patient becomes intolerant to PO fluid intake, becomes increasingly lethargic/weak, has difficulty breathing or chest pain, she should be seen again in the emergency room. Caregiver verbalizes understanding.

## 2024-01-18 NOTE — Progress Notes (Signed)
 Destiny Canard, PA-C [address] [place], [place]  [address] Phone: [phone]   Gastroenterology Consultation  Referring Provider:     Roderick Civatte, MD Primary Care Physician:  Roderick Civatte, MD Primary Gastroenterologist:  Destiny Canard, PA-C / Legrand Puma, MD  Reason for Consultation:     Dysphagia, history of peptic stricture        HPI:   Destiny Bruce is a 88 y.o. y/o female referred for consultation & management  by Roderick Civatte, MD.    01/17/24: [name] to [facility] ED for foreign body sensation.  Felt like she got a Pickle stuck in her throat on 01/16/24 when she was eating a sandwich.  There was a thinly sliced dill pickle on it and when she swallowed, she felt like it got stuck.  The next day she ate some scrambled egg and drank fluids.  Since the episode, she continues to feel like something is stuck in her throat / esophagus.  She cannot eat solid food such as meat or bread.  She is able to drink liquids.  She eats soup and applesauce.  No vomiting.  She cannot drink Boost or Ensure which cause diarrhea.  She has not had any imaging.  Currently takes Lansoprazole 15mg  daily for GERD.  PMH: She fell and fractured her sternum and hip 05/2023.  She is wheelchair dependent.  Here with her daughter today.  Pt. Lives at home and has a caregiver.  04/29/21: EGD by Dr. Elvin Hammer:  - One benign- appearing, intrinsic ringlike moderate stenosis was found at the gastroesophageal junction. This measured approximately 15 mm in diameter. After completing the endoscopic survey, TTS dilator was passed through the scope. Dilation with an 18- 19- 20 mm balloon dilator was performed to 20 mm. - The esophagus also revealed mild esophagitis at the stricture. No other abnormalities of the esophagus. - The stomach revealed a large hiatal hernia and small benign fundic gland type polyps. Biopsies were taken with a cold forceps for histology. - The examined duodenum was normal. - The  cardia and gastric fundus were normal  03/2015: EGD: Lower Esophageal stricture at GEJ, Dilated with 54 RF.  Hiatal Hernia, GERD.    Past Medical History:  Diagnosis Date   Arthritis    GERD (gastroesophageal reflux disease) 2008   Hiatal hernia 2008   Hx: UTI (urinary tract infection)    Hyperlipemia    Hypertension    Kyphosis    Osteoporosis    Stricture and stenosis of esophagus 2008   Uterine cancer [facility]) 2019   Dr. Sherlean Dirk    Past Surgical History:  Procedure Laterality Date   ABDOMINAL HYSTERECTOMY     2019   BREAST SURGERY     Cyst removed    HIP SURGERY     TUBAL LIGATION      Prior to Admission medications   Medication Sig Start Date End Date Taking? Authorizing Provider  aspirin  81 MG EC tablet Take 81 mg by mouth daily. Swallow whole.    [provider]  Calcium-Vitamin A-Vitamin D (LIQUID CALCIUM PO) Take 15 mLs by mouth 2 (two) times daily.     [provider]  Cholecalciferol (VITAMIN D3) 2000 UNITS TABS Take 1 tablet by mouth daily.    [provider]  Coenzyme Q10 (COQ10 PO) Take 1 capsule by mouth daily.    [provider]  Cyanocobalamin (VITAMIN B-12 PO) Take 1 capsule by mouth daily.  [provider]  lansoprazole (PREVACID) 15 MG capsule Take 15 mg by mouth daily as needed.     [provider]  metoprolol  (LOPRESSOR ) 50 MG tablet Take 25 mg by mouth 2 (two) times daily.    [provider]  VITAMIN E PO Take 1 capsule by mouth daily.    [provider]  Wheat Dextrin (BENEFIBER DRINK MIX PO) Take by mouth daily as needed.    [provider]    Family History  Problem Relation Age of Onset   GER disease Mother    Heart failure Father    Heart disease Sister    Lung cancer Brother    Cancer Maternal Grandfather    CAD Maternal Uncle    Prostate cancer Maternal Uncle    Cancer Paternal Aunt        "female cancer"   Leukemia Paternal Uncle    Colon cancer Neg Hx     Esophageal cancer Neg Hx    Stomach cancer Neg Hx      Social History   Tobacco Use   Smoking status: Never   Smokeless tobacco: Never  Substance Use Topics   Alcohol use: No    Alcohol/week: 0.0 standard drinks of alcohol   Drug use: No    Allergies as of 01/19/2024 - Review Complete 01/19/2024  Allergen Reaction Noted   Macrobid [nitrofurantoin monohyd macro] Nausea And Vomiting and Other (See Comments) 05/15/2016   Codeine Palpitations 05/26/2009    Review of Systems:    All systems reviewed and negative except where noted in HPI.   Physical Exam:  BP (!) 144/82   Pulse 72   Ht 4\' 10"  (1.473 m)   Wt 95 lb (43.1 kg)   BMI 19.86 kg/m  No LMP recorded. Patient is postmenopausal.  General:   Very feeble, elderly, frail female, sitting in a wheelchair, pleasant and cooperative in NAD.  Moderate kyphoscoliosis.  NOT able to get on exam table. Mouth: Oropharynx is clear without evidence of thrush.  No mouth lesions. Lungs:  Respirations even and unlabored.  Clear throughout to auscultation.   No wheezes, crackles, or rhonchi. No acute distress. Heart:  Regular rate and rhythm; no murmurs, clicks, rubs, or gallops. Abdomen:  Normal bowel sounds.  No bruits.  Soft, and non-distended without masses, hepatosplenomegaly or hernias noted.  Mild general Tenderness.  No guarding or rebound tenderness.     Imaging Studies: No results found.  Labs: CBC    Component Value Date/Time   WBC 8.7 05/15/2016 0334   RBC 4.02 05/15/2016 0334   HGB 12.2 05/15/2016 0334   HCT 37.5 05/15/2016 0334   PLT 238 05/15/2016 0334   MCV 93.3 05/15/2016 0334   MCH 30.3 05/15/2016 0334   MCHC 32.5 05/15/2016 0334   RDW 13.2 05/15/2016 0334   LYMPHSABS 1.7 05/15/2016 0334   MONOABS 1.2 (H) 05/15/2016 0334   EOSABS 0.1 05/15/2016 0334   BASOSABS 0.0 05/15/2016 0334    CMP     Component Value Date/Time   NA 132 (L) 05/16/2016 0527   K 4.3 05/16/2016 0527   CL 99 (L) 05/16/2016 0527    CO2 23 05/16/2016 0527   GLUCOSE 84 05/16/2016 0527   BUN 6 05/16/2016 0527   CREATININE 0.68 05/16/2016 0527   CALCIUM 9.2 05/16/2016 0527   PROT 6.8 05/14/2016 2050   ALBUMIN 4.0 05/14/2016 2050   AST 24 05/14/2016 2050   ALT 17 05/14/2016 2050   ALKPHOS 49 05/14/2016  2050   BILITOT 0.6 05/14/2016 2050   GFRNONAA >60 05/16/2016 0527   GFRAA >60 05/16/2016 0527    Assessment and Plan:   Lilliahna Parlato is a 88 y.o. y/o female has been referred for:  Dysphagia -Barium Swallow with Tablet -Pending results, decide if EGD w/ DIL is necessary -She would be high risk for EGD with Dilation due to advanced age and current feeble health status.  History of Esophageal Stricture; Last Dilated 04/2021 by DR. Elvin Hammer. -Barium Swallow with Tablet  Large Hiatal Hernia -Not a surgical candidate due to advanced age -Continue GERD medicine.  4.   GERD -Continue Prevacid 15mg  daily.  Follow up with Dr. Elvin Hammer in 4-6 weeks.  Also f/u based on Barium Swallow Results.  Destiny Canard, PA-C

## 2024-01-19 ENCOUNTER — Ambulatory Visit: Admitting: Physician Assistant

## 2024-01-19 ENCOUNTER — Encounter: Payer: Self-pay | Admitting: Physician Assistant

## 2024-01-19 VITALS — BP 144/82 | HR 72 | Ht <= 58 in | Wt 95.0 lb

## 2024-01-19 DIAGNOSIS — Z8719 Personal history of other diseases of the digestive system: Secondary | ICD-10-CM | POA: Diagnosis not present

## 2024-01-19 DIAGNOSIS — K219 Gastro-esophageal reflux disease without esophagitis: Secondary | ICD-10-CM

## 2024-01-19 DIAGNOSIS — K449 Diaphragmatic hernia without obstruction or gangrene: Secondary | ICD-10-CM | POA: Diagnosis not present

## 2024-01-19 DIAGNOSIS — R1310 Dysphagia, unspecified: Secondary | ICD-10-CM | POA: Diagnosis not present

## 2024-01-19 NOTE — Patient Instructions (Signed)
 You have been scheduled for a Barium Esophogram at [facility] (1st floor of the hospital) on 02/07/24 at 9 am. Please arrive 30 minutes prior to your appointment for registration. Make certain not to have anything to eat or drink 3 hours prior to your test. If you need to reschedule for any reason, please contact radiology at [phone] to do so. __________________________________________________________________ A barium swallow is an examination that concentrates on views of the esophagus. This tends to be a double contrast exam (barium and two liquids which, when combined, create a gas to distend the wall of the oesophagus) or single contrast (non-ionic iodine based). The study is usually tailored to your symptoms so a good history is essential. Attention is paid during the study to the form, structure and configuration of the esophagus, looking for functional disorders (such as aspiration, dysphagia, achalasia, motility and reflux) EXAMINATION You may be asked to change into a gown, depending on the type of swallow being performed. A radiologist and radiographer will perform the procedure. The radiologist will advise you of the type of contrast selected for your procedure and direct you during the exam. You will be asked to stand, sit or lie in several different positions and to hold a small amount of fluid in your mouth before being asked to swallow while the imaging is performed .In some instances you may be asked to swallow barium coated marshmallows to assess the motility of a solid food bolus. The exam can be recorded as a digital or video fluoroscopy procedure. POST PROCEDURE It will take 1-2 days for the barium to pass through your system. To facilitate this, it is important, unless otherwise directed, to increase your fluids for the next 24-48hrs and to resume your normal diet.  This test typically takes about 30 minutes to  perform. __________________________________________________________________________________  _______________________________________________________  If your blood pressure at your visit was 140/90 or greater, please contact your primary care physician to follow up on this.  _______________________________________________________  If you are age 59 or older, your body mass index should be between 23-30. Your Body mass index is 19.86 kg/m. If this is out of the aforementioned range listed, please consider follow up with your Primary Care Provider.  If you are age 61 or younger, your body mass index should be between 19-25. Your Body mass index is 19.86 kg/m. If this is out of the aformentioned range listed, please consider follow up with your Primary Care Provider.   ________________________________________________________  The  GI providers would like to encourage you to use MYCHART to communicate with providers for non-urgent requests or questions.  Due to long hold times on the telephone, sending your provider a message by [facility] may be a faster and more efficient way to get a response.  Please allow 48 business hours for a response.  Please remember that this is for non-urgent requests.  _______________________________________________________

## 2024-01-19 NOTE — Progress Notes (Signed)
 Noted.

## 2024-02-07 ENCOUNTER — Ambulatory Visit (HOSPITAL_COMMUNITY)
Admission: RE | Admit: 2024-02-07 | Discharge: 2024-02-07 | Disposition: A | Discharge status: Home / Self Care | Source: Ambulatory Visit | Attending: Physician Assistant | Admitting: Physician Assistant

## 2024-02-07 ENCOUNTER — Other Ambulatory Visit: Payer: Self-pay | Admitting: Physician Assistant

## 2024-02-07 ENCOUNTER — Ambulatory Visit: Payer: Self-pay | Admitting: Physician Assistant

## 2024-02-07 DIAGNOSIS — K449 Diaphragmatic hernia without obstruction or gangrene: Secondary | ICD-10-CM

## 2024-02-07 DIAGNOSIS — Z8719 Personal history of other diseases of the digestive system: Secondary | ICD-10-CM

## 2024-02-07 DIAGNOSIS — R1310 Dysphagia, unspecified: Secondary | ICD-10-CM | POA: Insufficient documentation

## 2024-02-07 DIAGNOSIS — K219 Gastro-esophageal reflux disease without esophagitis: Secondary | ICD-10-CM

## 2024-02-14 ENCOUNTER — Ambulatory Visit: Admitting: Gastroenterology

## 2024-03-05 ENCOUNTER — Encounter: Payer: Self-pay | Admitting: Internal Medicine

## 2024-03-05 ENCOUNTER — Ambulatory Visit: Admitting: Internal Medicine

## 2024-03-05 VITALS — BP 120/80 | HR 70

## 2024-03-05 DIAGNOSIS — R1310 Dysphagia, unspecified: Secondary | ICD-10-CM | POA: Diagnosis not present

## 2024-03-05 NOTE — Patient Instructions (Signed)
 Please follow up as needed.  _______________________________________________________  If your blood pressure at your visit was 140/90 or greater, please contact your primary care physician to follow up on this.  _______________________________________________________  If you are age 86 or older, your body mass index should be between 23-30. Your There is no height or weight on file to calculate BMI. If this is out of the aforementioned range listed, please consider follow up with your Primary Care Provider.  If you are age 71 or younger, your body mass index should be between 19-25. Your There is no height or weight on file to calculate BMI. If this is out of the aformentioned range listed, please consider follow up with your Primary Care Provider.   ________________________________________________________  The [facility] providers would like to encourage you to use MYCHART to communicate with providers for non-urgent requests or questions.  Due to long hold times on the telephone, sending your provider a message by [facility] may be a faster and more efficient way to get a response.  Please allow 48 business hours for a response.  Please remember that this is for non-urgent requests.  _______________________________________________________

## 2024-03-05 NOTE — Progress Notes (Signed)
 HISTORY OF PRESENT ILLNESS:  Destiny Bruce is a 88 y.o. female with multiple significant medical problems as listed below.  She does have a history of GERD complicated by peptic stricture for which she has undergone previous esophageal dilation.  Most recently, August 2022.  Follow-up presents today regarding issues with dysphagia.  She is accompanied by her daughter Destiny Bruce.  The patient was recently seen in the office Jan 19, 2024 regarding dysphagia, particularly solids.  Since I last saw her she has has had significant decline in her health.  She is quite kyphotic and wheelchair-bound.  Has full-time help at home.  To further evaluate her dysphagia a barium swallow with tablet was performed Feb 07, 2024.  Esophagram was somewhat limited but there was a suggestion of mild smooth narrowing at the distalmost portion.  Reviewed.  Not significantly abnormal.  She declined to swallow a barium tablet as she notes that she cannot swallow her pills they are not crushed.  She does seem to do okay liquids and soft foods.  She points to the region of the sternal notch when she describes her swallowing trouble.  Different than previous more distal esophageal type dysphagia.  REVIEW OF SYSTEMS:  All non-GI ROS negative except for hoarseness  Past Medical History:  Diagnosis Date   Arthritis    GERD (gastroesophageal reflux disease) 2008   Hiatal hernia 2008   Hx: UTI (urinary tract infection)    Hyperlipemia    Hypertension    Kyphosis    Osteoporosis    Stricture and stenosis of esophagus 2008   Uterine cancer [facility]) 2019   Dr. Sherlean Dirk    Past Surgical History:  Procedure Laterality Date   ABDOMINAL HYSTERECTOMY     2019   BREAST SURGERY     Cyst removed    HIP SURGERY     TUBAL LIGATION      Social History Destiny Bruce  reports that she has never smoked. She has never used smokeless tobacco. She reports that she does not drink alcohol and does not use drugs.  family history  includes CAD in her maternal uncle; Cancer in her maternal grandfather and paternal aunt; GER disease in her mother; Heart disease in her sister; Heart failure in her father; Leukemia in her paternal uncle; Lung cancer in her brother; Prostate cancer in her maternal uncle.  Allergies  Allergen Reactions   Macrobid [Nitrofurantoin Monohyd Macro] Nausea And Vomiting and Other (See Comments)    Pt can tolerate Nitrofurantoin but not the Macrobid. Macrobid makes pt very sick   Codeine Palpitations    Pt states she feels like her heart is going to jump out of her chest       PHYSICAL EXAMINATION: Vital signs: BP 120/80   Pulse 70   Constitutional: Frail, chronically ill-appearing, markedly kyphotic with chin near chest, no acute distress.  Wheelchair-bound Psychiatric: alert and oriented x3, cooperative Eyes: extraocular movements intact, anicteric, conjunctiva pink Mouth: oral pharynx moist, no lesions Neck: Fixed flexion position without significant mobility Cardiovascular: heart regular rate and rhythm, no murmur Lungs: clear to auscultation bilaterally Abdomen: soft, nontender, nondistended, no obvious ascites, no peritoneal signs, normal bowel sounds, no organomegaly Rectal: Omitted Extremities: no clubbing, cyanosis, lower extremity edema bilaterally Skin: no lesions on visible extremities Neuro: No focal deficits.   ASSESSMENT:  1.  Dysphagia related to marked kyphosis.  No significant abnormalities on esophagram. 2.  Multiple medical problems and advanced age   PLAN:  1.  Liquids, soft foods,  and pured items.  Avoid meats or other solids 2.  Resume general medical care with PCP. 3.  GI follow-up as needed A total time of 30 minutes was spent preparing to see the patient, reviewing interval studies, obtaining interval history, performing medically appropriate physical exam, counseling and educating the patient and her daughter regarding the above listed issues, and  documenting clinical information in the health record
